# Patient Record
Sex: Female | Born: 1935 | Race: Black or African American | Hispanic: No | Smoking: Never smoker
Health system: Southern US, Community
[De-identification: ages and names within clinical notes are randomized; demographics above are authoritative.]

## PROBLEM LIST (undated history)

## (undated) DIAGNOSIS — E7800 Pure hypercholesterolemia, unspecified: Secondary | ICD-10-CM

## (undated) DIAGNOSIS — I10 Essential (primary) hypertension: Secondary | ICD-10-CM

## (undated) HISTORY — DX: Pure hypercholesterolemia, unspecified: E78.00

## (undated) HISTORY — PX: COLONOSCOPY: SHX174

## (undated) HISTORY — PX: ABDOMINAL HYSTERECTOMY: SHX81

## (undated) HISTORY — DX: Essential (primary) hypertension: I10

---

## 2001-05-04 ENCOUNTER — Other Ambulatory Visit: Admission: RE | Admit: 2001-05-04 | Discharge: 2001-05-04 | Payer: Self-pay | Admitting: Internal Medicine

## 2001-05-25 ENCOUNTER — Encounter: Payer: Self-pay | Admitting: Internal Medicine

## 2001-05-25 ENCOUNTER — Ambulatory Visit (HOSPITAL_COMMUNITY): Admission: RE | Admit: 2001-05-25 | Discharge: 2001-05-25 | Payer: Self-pay | Admitting: Internal Medicine

## 2002-05-25 ENCOUNTER — Ambulatory Visit (HOSPITAL_COMMUNITY): Admission: RE | Admit: 2002-05-25 | Discharge: 2002-05-25 | Payer: Self-pay | Admitting: Internal Medicine

## 2002-05-25 ENCOUNTER — Encounter: Payer: Self-pay | Admitting: Internal Medicine

## 2003-06-23 ENCOUNTER — Ambulatory Visit (HOSPITAL_COMMUNITY): Admission: RE | Admit: 2003-06-23 | Discharge: 2003-06-23 | Payer: Self-pay | Admitting: Internal Medicine

## 2004-12-06 ENCOUNTER — Ambulatory Visit (HOSPITAL_COMMUNITY): Admission: RE | Admit: 2004-12-06 | Discharge: 2004-12-06 | Payer: Self-pay | Admitting: Internal Medicine

## 2005-01-16 ENCOUNTER — Ambulatory Visit: Payer: Self-pay | Admitting: Internal Medicine

## 2005-01-16 ENCOUNTER — Ambulatory Visit (HOSPITAL_COMMUNITY): Admission: RE | Admit: 2005-01-16 | Discharge: 2005-01-16 | Payer: Self-pay | Admitting: Internal Medicine

## 2005-01-16 ENCOUNTER — Encounter: Payer: Self-pay | Admitting: Internal Medicine

## 2005-12-08 ENCOUNTER — Ambulatory Visit (HOSPITAL_COMMUNITY): Admission: RE | Admit: 2005-12-08 | Discharge: 2005-12-08 | Payer: Self-pay | Admitting: Internal Medicine

## 2006-01-09 ENCOUNTER — Ambulatory Visit (HOSPITAL_COMMUNITY): Admission: RE | Admit: 2006-01-09 | Discharge: 2006-01-09 | Payer: Self-pay | Admitting: Internal Medicine

## 2006-01-21 ENCOUNTER — Ambulatory Visit: Payer: Self-pay | Admitting: Orthopedic Surgery

## 2006-12-10 ENCOUNTER — Ambulatory Visit (HOSPITAL_COMMUNITY): Admission: RE | Admit: 2006-12-10 | Discharge: 2006-12-10 | Payer: Self-pay | Admitting: Internal Medicine

## 2007-12-15 ENCOUNTER — Ambulatory Visit (HOSPITAL_COMMUNITY): Admission: RE | Admit: 2007-12-15 | Discharge: 2007-12-15 | Payer: Self-pay | Admitting: Internal Medicine

## 2008-12-15 ENCOUNTER — Ambulatory Visit (HOSPITAL_COMMUNITY): Admission: RE | Admit: 2008-12-15 | Discharge: 2008-12-15 | Payer: Self-pay | Admitting: Internal Medicine

## 2009-10-31 ENCOUNTER — Ambulatory Visit (HOSPITAL_COMMUNITY): Admission: RE | Admit: 2009-10-31 | Discharge: 2009-10-31 | Payer: Self-pay | Admitting: Family Medicine

## 2010-01-01 ENCOUNTER — Ambulatory Visit (HOSPITAL_COMMUNITY): Admission: RE | Admit: 2010-01-01 | Discharge: 2010-01-01 | Payer: Self-pay | Admitting: Internal Medicine

## 2010-10-18 NOTE — Op Note (Signed)
NAME:  Tanya Daniel, Tanya Daniel                  ACCOUNT NO.:  [id number]   MEDICAL RECORD NO.:  [id number]          PATIENT TYPE:  AMB   LOCATION:  DAY                           FACILITY:  [facility]   PHYSICIAN:  R. Roetta Sessions, M.D. DATE OF BIRTH:  [birth year]   DATE OF PROCEDURE:  01/16/2005  DATE OF DISCHARGE:                                 OPERATIVE REPORT   INDICATIONS FOR PROCEDURE:  The patient is a 75 year old lady who was sent  over for colorectal cancer screening at the request of Dr. Felecia Shelling.  She is  devoid of any allergic reaction symptoms.  She has never had a colonoscopy.  There is no family history of colorectal neoplasia.   The colonoscopy is now being done.  This approach has been discussed with  the patient at length.  Potential risks, benefits, and alternatives have  been reviewed.  Questions are answered.  She is agreeable.  Please see  documentation of medical record.   MONITORING FOR PROCEDURE:  O2 Saturation, blood pressure, pulses, and  respirations were monitored during the entire procedure.   CONSCIOUS SEDATION:  Versed 3 mg IV, Demerol 75 mg IV in divided doses.   INSTRUMENT:  Olympus videoscopic system.   FINDINGS:  A digital rectal exam revealed no abnormalities.  __________ prep  was adequate.  Rectal:  There was no abnormality of mucosa.  Retroflexed  view of the anal verge reveals two 4-mm rectal polyps at 10 cm from the anal  verge; please see photos.  The remainder of the rectum does appear normal.  Colon:  Colonic mucosa was surveyed from the rectosigmoid junction through  the left transverse and right colon to the appendiceal orifice, ileocecal  valve, and cecum.  These structures were well seen and photographed for the  record.  The scope was slowly withdrawn.  All previously mentioned mucosa  was again seen.  The colonic mucosa appeared normal.  The polyps in the  rectum were cold biopsied/removed.  The patient tolerated the procedure well  and was reactive  in endoscopy.   IMPRESSION:  1.  Diminutive rectal polyps, otherwise normal rectal.  2.  Normal colon.   RECOMMENDATIONS:  1.  Follow up on pathology.  2.  Further recommendations to follow.      Jonathon Bellows, M.D.  Electronically Signed     RMR/MEDQ  D:  01/16/2005  T:  01/16/2005  Job:  [id number]   cc:   Tesfaye D. Felecia Shelling, MD  [address]  [place]  [place] [address]  Fax: [phone]

## 2010-11-04 ENCOUNTER — Ambulatory Visit (HOSPITAL_COMMUNITY)
Admission: RE | Admit: 2010-11-04 | Discharge: 2010-11-04 | Disposition: A | Discharge status: Home / Self Care | Payer: Medicare Other | Source: Ambulatory Visit | Attending: Internal Medicine | Admitting: Internal Medicine

## 2010-11-04 ENCOUNTER — Other Ambulatory Visit (HOSPITAL_COMMUNITY): Payer: Self-pay | Admitting: Internal Medicine

## 2010-11-04 DIAGNOSIS — M25519 Pain in unspecified shoulder: Secondary | ICD-10-CM

## 2010-11-08 NOTE — Consult Note (Signed)
NAMEYOLANDE, Tanya Daniel                  ACCOUNT NO.:  [id number]  MEDICAL RECORD NO.:  [id number]  LOCATION:  RAD                           FACILITY:  [facility]  PHYSICIAN:  Laurette Schimke, MD     DATE OF BIRTH:  12/03/1935  DATE OF CONSULTATION: DATE OF DISCHARGE:  11/04/2010                                CONSULTATION   REQUESTED BY:  Leighton Roach McDiarmid, M.D.  REASON FOR VISIT:  Referral requested by Dr. McDiarmid for management of a pelvic mass.  HISTORY OF PRESENT ILLNESS:  This is a 75 year old gravida 2, para 2, last normal menstrual period in 1987, at which time she underwent a hysterectomy for menorrhagia because of uterine leiomyoma.  She reports uterine frequency for approximately 1 year.  Denied any weight loss, reported good appetite.  No changes in her bowel or rectal habits.  She was seen by Dr. McDiarmid, and imaging was obtained.  The  CT scan of the abdomen and pelvis was obtained and was notable for left pelvicalyceal fullness.  The ureters were sightly prominent in the pelvis where there was compression by a large septated mid pelvic mass. This mid pelvic mass measured approximately 17.7 x 19.8 x 15.3 cm and was noted to have soft tissue component.  The CA-125 is not available for review.  PAST MEDICAL HISTORY:  Hyperlipidemia.  PAST SURGICAL HISTORY:  Left salpingo-oophorectomy, total abdominal hysterectomy in 1987.  PAST GYN HISTORY:  Menarche occurred at age of 10 with regular menses until development of menorrhagia that required hysterectomy.  She denies a history of abnormal Pap test.  FAMILY HISTORY:  Brother with esophageal cancer diagnosed at the age of 65, who is tobacco user and a second brother with prostate cancer diagnosed at the age of 53.  SOCIAL HISTORY:  She denies tobacco use, reports social alcohol use. She has been widowed since March 2012.  Her daughter has moved in with her.  REVIEW OF SYSTEMS:  No headache, chest pain, shortness of  breath, hemoptysis, no abdominal pain, bloating.  No changes in appetite. Denies early satiety.  No nausea, vomiting, change in bowel or rectal habits.  No hematuria, hematochezia or vaginal bleeding.  No lower extremity edema.  Otherwise, 10-point review of systems is negative.  PHYSICAL EXAMINATION:  GENERAL:  Well-developed female, in no acute distress. VITAL SIGNS:  Weight 215 pounds, height 5 feet 8 inches, blood pressure 140/80, pulse of 86, temperature 98.5. CHEST:  Clear to auscultation. HEART:  Regular rate and rhythm. BACK:  No CVA tenderness. LYMPH NODE SURVEY:  No cervical, subclavicular, or inguinal adenopathy. ABDOMEN:  Soft, nontender.  Midline incision visible.  Mass palpable in the lower abdomen on the right. PELVIC:  Normal external genitalia, Bartholin, urethra, and Skene. Smooth, large, fixed pelvic mass notable on pelvic examination, confirmed on rectal examination. EXTREMITIES:  No clubbing, cyanosis or edema.  IMPRESSION:  Ms. Gordner is a 75 year old with a 19.8 cm pelvic mass with soft tissue component.  CA-125 will be obtained preoperatively.  The recommendation made to her was that of exploratory laparotomy with minimum bilateral salpingo-oophorectomy.  Frozen section findings are indicative of a malignancy.  The plan would  be to proceed with omentectomy, debulking and staging as indicated.  The risks and benefits of surgery discussed with her and her daughter, were inclusive of infection, bleeding, damage to surrounding structures, prolonged hospitalization, and reoperation.  This procedure is planned for December 03, 2010.  All of her questions and those of her daughters were answered to satisfaction.     Laurette Schimke, MD     WB/MEDQ  D:  11/07/2010  T:  11/08/2010  Job:  [id number]  cc:   Leighton Roach McDiarmid, M.D.  Telford Nab, R.N. [address] [place], [place] [address]  Electronically Signed by Laurette Schimke MD on 11/08/2010 09:55:34 AM

## 2010-12-17 ENCOUNTER — Encounter: Payer: Self-pay | Admitting: Orthopedic Surgery

## 2010-12-17 ENCOUNTER — Ambulatory Visit (INDEPENDENT_AMBULATORY_CARE_PROVIDER_SITE_OTHER): Payer: Medicare Other | Admitting: Orthopedic Surgery

## 2010-12-17 ENCOUNTER — Other Ambulatory Visit (HOSPITAL_COMMUNITY): Payer: Self-pay | Admitting: Internal Medicine

## 2010-12-17 DIAGNOSIS — M7580 Other shoulder lesions, unspecified shoulder: Secondary | ICD-10-CM

## 2010-12-17 DIAGNOSIS — Z139 Encounter for screening, unspecified: Secondary | ICD-10-CM

## 2010-12-17 DIAGNOSIS — M25519 Pain in unspecified shoulder: Secondary | ICD-10-CM

## 2010-12-17 DIAGNOSIS — M19019 Primary osteoarthritis, unspecified shoulder: Secondary | ICD-10-CM

## 2010-12-17 DIAGNOSIS — M67919 Unspecified disorder of synovium and tendon, unspecified shoulder: Secondary | ICD-10-CM

## 2010-12-17 NOTE — Patient Instructions (Signed)
You have arthritis of the shoulder and rotator cuff disease   Start exercise program  Ok to take advil or aspirin

## 2010-12-17 NOTE — Progress Notes (Signed)
  Subjective:    Tanya Daniel is a 76 y.o. female who presents with left shoulder pain. The symptoms began several weeks ago. Aggravating factors: no known event. Pain is located around the left deltoid. Discomfort is described as aching. Symptoms are exacerbated by overhead movements. Evaluation to date: plain films: a.c. joint arthritis and possible glenohumeral arthritis with mild glenohumeral degenerative changes. Therapy to date includes: OTC analgesics which are effective and prescription NSAIDS which are effective.  The following portions of the patient's history were reviewed and updated as appropriate: allergies, current medications, past family history, past medical history, past social history, past surgical history and problem list.  Review of Systems A comprehensive review of systems was negative.   Objective:    Resp 18  Ht 5\' 3"  (1.6 m)  Wt 151 lb (68.493 kg)  BMI 26.75 kg/m2 Right shoulder: normal active ROM, no tenderness, no impingement sign  Left shoulder: crepitus with ROM    The LEFT shoulder has a negative impingement sign and a negative Hawkins sign with full range of motion but crepitance no tenderness over the a.c. Joint weakness of the rotator cuff especially in abduction although the shoulder remained stable there is no tenderness around the deltoid.  The cervical spine is normal and the reflexes were normal  The overall appearance of the patient was normal.  Her vital signs are as recorded.  Both upper extremities were normal in terms of radial and ulnar pulses and color.  There is no lymphadenopathy in the axillae and the skin over both shoulders and cervical spine was normal  The patient was awake alert and oriented x3 the mood and affect were normal.  Assessment:    Left acromioclavicular joint arthritis, glenohumeral arthritis, rotator cuff tendinitis    Plan:    Gentle ROM exercises. NSAIDs per medication orders.

## 2011-01-03 ENCOUNTER — Ambulatory Visit (HOSPITAL_COMMUNITY)
Admission: RE | Admit: 2011-01-03 | Discharge: 2011-01-03 | Disposition: A | Discharge status: Home / Self Care | Payer: Medicare Other | Source: Ambulatory Visit | Attending: Internal Medicine | Admitting: Internal Medicine

## 2011-01-03 DIAGNOSIS — Z1231 Encounter for screening mammogram for malignant neoplasm of breast: Secondary | ICD-10-CM | POA: Insufficient documentation

## 2011-01-03 DIAGNOSIS — Z139 Encounter for screening, unspecified: Secondary | ICD-10-CM

## 2012-01-26 ENCOUNTER — Other Ambulatory Visit (HOSPITAL_COMMUNITY): Payer: Self-pay | Admitting: Internal Medicine

## 2012-01-26 DIAGNOSIS — Z139 Encounter for screening, unspecified: Secondary | ICD-10-CM

## 2012-01-27 ENCOUNTER — Ambulatory Visit (HOSPITAL_COMMUNITY)
Admission: RE | Admit: 2012-01-27 | Discharge: 2012-01-27 | Disposition: A | Discharge status: Home / Self Care | Payer: Medicare Other | Source: Ambulatory Visit | Attending: Internal Medicine | Admitting: Internal Medicine

## 2012-01-27 DIAGNOSIS — Z139 Encounter for screening, unspecified: Secondary | ICD-10-CM

## 2012-01-27 DIAGNOSIS — Z1231 Encounter for screening mammogram for malignant neoplasm of breast: Secondary | ICD-10-CM | POA: Insufficient documentation

## 2012-12-21 ENCOUNTER — Other Ambulatory Visit (HOSPITAL_COMMUNITY): Payer: Self-pay | Admitting: Internal Medicine

## 2012-12-21 DIAGNOSIS — Z139 Encounter for screening, unspecified: Secondary | ICD-10-CM

## 2013-02-01 ENCOUNTER — Ambulatory Visit (HOSPITAL_COMMUNITY)
Admission: RE | Admit: 2013-02-01 | Discharge: 2013-02-01 | Disposition: A | Discharge status: Home / Self Care | Payer: Medicare Other | Source: Ambulatory Visit | Attending: Internal Medicine | Admitting: Internal Medicine

## 2013-02-01 DIAGNOSIS — Z1231 Encounter for screening mammogram for malignant neoplasm of breast: Secondary | ICD-10-CM | POA: Insufficient documentation

## 2013-02-01 DIAGNOSIS — Z139 Encounter for screening, unspecified: Secondary | ICD-10-CM

## 2013-09-01 ENCOUNTER — Telehealth: Payer: Self-pay

## 2013-09-01 NOTE — Telephone Encounter (Signed)
Pt returned call. She is not having any problems. No rectal bleed. No anemia. No family hx of colon cancer. She is aware that she will be due next in 01/2015. She will call if she develops any of the above and has any new family hx of colon cancer.

## 2013-09-01 NOTE — Telephone Encounter (Signed)
Pt was referred by Dr. Legrand Rams for screening colonoscopy. Her last one was done by Dr. Gala Romney on 01/16/2005.   The impression was: 1. Diminutive rectal polyps, otherwise normal rectal. 2. Normal colon.  Path: Diagnosis:  1. Polyp ( rectum, polypectomy:      INFLAMED, FOCALLY ULCERATED, HYPERPLASTIC POLYPS

## 2013-09-01 NOTE — Telephone Encounter (Signed)
Pt had received a letter to call. She left Vm and I returned her call and LMOM for a return call.

## 2014-01-18 ENCOUNTER — Other Ambulatory Visit (HOSPITAL_COMMUNITY): Payer: Self-pay | Admitting: Internal Medicine

## 2014-01-18 DIAGNOSIS — Z1231 Encounter for screening mammogram for malignant neoplasm of breast: Secondary | ICD-10-CM

## 2014-01-20 ENCOUNTER — Other Ambulatory Visit (HOSPITAL_COMMUNITY): Payer: Self-pay | Admitting: Internal Medicine

## 2014-01-20 DIAGNOSIS — Z13820 Encounter for screening for osteoporosis: Secondary | ICD-10-CM

## 2014-01-24 ENCOUNTER — Ambulatory Visit (HOSPITAL_COMMUNITY)
Admission: RE | Admit: 2014-01-24 | Discharge: 2014-01-24 | Disposition: A | Discharge status: Home / Self Care | Payer: Medicare Other | Source: Ambulatory Visit | Attending: Internal Medicine | Admitting: Internal Medicine

## 2014-01-24 DIAGNOSIS — Z13820 Encounter for screening for osteoporosis: Secondary | ICD-10-CM | POA: Insufficient documentation

## 2014-02-02 ENCOUNTER — Ambulatory Visit (HOSPITAL_COMMUNITY)
Admission: RE | Admit: 2014-02-02 | Discharge: 2014-02-02 | Disposition: A | Discharge status: Home / Self Care | Payer: Medicare Other | Source: Ambulatory Visit | Attending: Internal Medicine | Admitting: Internal Medicine

## 2014-02-02 DIAGNOSIS — Z1231 Encounter for screening mammogram for malignant neoplasm of breast: Secondary | ICD-10-CM | POA: Diagnosis not present

## 2014-04-02 DEATH — deceased

## 2014-07-21 DIAGNOSIS — M549 Dorsalgia, unspecified: Secondary | ICD-10-CM | POA: Diagnosis not present

## 2014-07-21 DIAGNOSIS — M1990 Unspecified osteoarthritis, unspecified site: Secondary | ICD-10-CM | POA: Diagnosis not present

## 2014-07-21 DIAGNOSIS — E785 Hyperlipidemia, unspecified: Secondary | ICD-10-CM | POA: Diagnosis not present

## 2014-08-24 ENCOUNTER — Telehealth: Payer: Self-pay | Admitting: Internal Medicine

## 2014-08-24 NOTE — Telephone Encounter (Signed)
ON RECALL FOR TCS °

## 2014-08-24 NOTE — Telephone Encounter (Signed)
Letter mailed to pt.  

## 2014-09-22 DIAGNOSIS — I10 Essential (primary) hypertension: Secondary | ICD-10-CM | POA: Diagnosis not present

## 2014-09-22 DIAGNOSIS — M549 Dorsalgia, unspecified: Secondary | ICD-10-CM | POA: Diagnosis not present

## 2014-12-22 DIAGNOSIS — Z0001 Encounter for general adult medical examination with abnormal findings: Secondary | ICD-10-CM | POA: Diagnosis not present

## 2014-12-22 DIAGNOSIS — M1990 Unspecified osteoarthritis, unspecified site: Secondary | ICD-10-CM | POA: Diagnosis not present

## 2014-12-22 DIAGNOSIS — E785 Hyperlipidemia, unspecified: Secondary | ICD-10-CM | POA: Diagnosis not present

## 2014-12-22 DIAGNOSIS — E780 Pure hypercholesterolemia: Secondary | ICD-10-CM | POA: Diagnosis not present

## 2014-12-22 DIAGNOSIS — I10 Essential (primary) hypertension: Secondary | ICD-10-CM | POA: Diagnosis not present

## 2014-12-25 ENCOUNTER — Telehealth: Payer: Self-pay

## 2014-12-25 NOTE — Telephone Encounter (Signed)
Pt received a letter in March 2016 for recall on colonoscopy. Last one was 01/16/2005 by Dr. Gala Romney. I have taken some triage info and she will bring her insurance card by tomorrow and let Marzetta Board make a copy before I get her scheduled.

## 2014-12-28 NOTE — Telephone Encounter (Signed)
Gastroenterology Pre-Procedure Review  Request Date: 12/25/2014 Requesting Physician: PT WAS ON RECALL/ LAST COLONOSCOPY 01/16/2005 BY DR Gala Romney  PATIENT REVIEW QUESTIONS: The patient responded to the following health history questions as indicated:    1. Diabetes Melitis: no 2. Joint replacements in the past 12 months: no 3. Major health problems in the past 3 months: no 4. Has an artificial valve or MVP: no 5. Has a defibrillator: no 6. Has been advised in past to take antibiotics in advance of a procedure like teeth cleaning: no    MEDICATIONS & ALLERGIES:    Patient reports the following regarding taking any blood thinners:   Plavix? no Aspirin? YES Coumadin? no  Patient confirms/reports the following medications:  Current Outpatient Prescriptions  Medication Sig Dispense Refill  . alendronate (FOSAMAX) 70 MG tablet Take 70 mg by mouth once a week. Take with a full glass of water on an empty stomach.    Marland Kitchen lisinopril-hydrochlorothiazide (PRINZIDE,ZESTORETIC) 10-12.5 MG per tablet Take 1 tablet by mouth daily.    . Multiple Vitamin (MULTIVITAMIN) tablet Take 1 tablet by mouth daily.    . NON FORMULARY Vitamin D3    500 IU  Takes 2 tablets daily    . Omega-3 Fatty Acids (FISH OIL) 1000 MG CAPS Take by mouth. Taking one capsule tid    . simvastatin (ZOCOR) 40 MG tablet Take 40 mg by mouth daily.    . traMADol (ULTRAM) 50 MG tablet Take 50 mg by mouth every 6 (six) hours as needed. Only as needed    . Niacin, Antihyperlipidemic, (NIASPAN PO) Take by mouth.       No current facility-administered medications for this visit.    Patient confirms/reports the following allergies:  No Known Allergies  No orders of the defined types were placed in this encounter.    AUTHORIZATION INFORMATION Primary Insurance:   ID #:   Group #:  Pre-Cert / Auth required:  Pre-Cert / Auth #:   Secondary Insurance:   ID #:   Group #: Pre-Cert / Auth required: Pre-Cert / Auth #:   SCHEDULE  INFORMATION: Procedure has been scheduled as follows:  Date: 01/22/2015                 Time:  1:00 pm Location: [facility] Short Stay  This Gastroenterology Pre-Precedure Review Form is being routed to the following provider(s): R. Garfield Cornea, MD

## 2014-12-29 NOTE — Telephone Encounter (Signed)
Appropriate.

## 2015-01-01 MED ORDER — PEG-KCL-NACL-NASULF-NA ASC-C 100 G PO SOLR: 1.0000 | ORAL | Status: DC

## 2015-01-01 NOTE — Telephone Encounter (Signed)
Rx sent to the pharmacy and instructions mailed to pt.  

## 2015-01-03 ENCOUNTER — Other Ambulatory Visit: Payer: Self-pay

## 2015-01-03 DIAGNOSIS — Z1211 Encounter for screening for malignant neoplasm of colon: Secondary | ICD-10-CM

## 2015-01-17 ENCOUNTER — Telehealth: Payer: Self-pay

## 2015-01-17 NOTE — Telephone Encounter (Signed)
I called [facility] @ [phone] and spoke to Heyburn B who said a PA is not required for screening colonoscopy done as out patient.

## 2015-01-22 ENCOUNTER — Ambulatory Visit (HOSPITAL_COMMUNITY)
Admission: RE | Admit: 2015-01-22 | Discharge: 2015-01-22 | Disposition: A | Discharge status: Home / Self Care | Payer: Medicare Other | Source: Ambulatory Visit | Attending: Internal Medicine | Admitting: Internal Medicine

## 2015-01-22 ENCOUNTER — Encounter (HOSPITAL_COMMUNITY)
Admission: RE | Disposition: A | Discharge status: Home / Self Care | Payer: Self-pay | Source: Ambulatory Visit | Attending: Internal Medicine

## 2015-01-22 ENCOUNTER — Encounter (HOSPITAL_COMMUNITY): Payer: Self-pay | Admitting: *Deleted

## 2015-01-22 DIAGNOSIS — Z79899 Other long term (current) drug therapy: Secondary | ICD-10-CM | POA: Diagnosis not present

## 2015-01-22 DIAGNOSIS — D120 Benign neoplasm of cecum: Secondary | ICD-10-CM

## 2015-01-22 DIAGNOSIS — Z1211 Encounter for screening for malignant neoplasm of colon: Secondary | ICD-10-CM | POA: Insufficient documentation

## 2015-01-22 DIAGNOSIS — K635 Polyp of colon: Secondary | ICD-10-CM | POA: Insufficient documentation

## 2015-01-22 DIAGNOSIS — Z7982 Long term (current) use of aspirin: Secondary | ICD-10-CM | POA: Diagnosis not present

## 2015-01-22 DIAGNOSIS — I10 Essential (primary) hypertension: Secondary | ICD-10-CM | POA: Insufficient documentation

## 2015-01-22 DIAGNOSIS — E780 Pure hypercholesterolemia: Secondary | ICD-10-CM | POA: Diagnosis not present

## 2015-01-22 HISTORY — PX: COLONOSCOPY: SHX5424

## 2015-01-22 SURGERY — COLONOSCOPY
Anesthesia: Moderate Sedation

## 2015-01-22 MED ORDER — ONDANSETRON HCL 4 MG/2ML IJ SOLN
INTRAMUSCULAR | Status: DC | PRN
  Administered 2015-01-22: 4 mg via INTRAVENOUS

## 2015-01-22 MED ORDER — MIDAZOLAM HCL 5 MG/5ML IJ SOLN
INTRAMUSCULAR | Status: DC | PRN
  Administered 2015-01-22: 1 mg via INTRAVENOUS
  Administered 2015-01-22 (×2): 2 mg via INTRAVENOUS

## 2015-01-22 MED ORDER — MEPERIDINE HCL 100 MG/ML IJ SOLN
INTRAMUSCULAR | Status: AC
  Filled 2015-01-22: qty 2

## 2015-01-22 MED ORDER — SODIUM CHLORIDE 0.9 % IV SOLN
INTRAVENOUS | Status: DC
  Administered 2015-01-22: 12:00:00 via INTRAVENOUS

## 2015-01-22 MED ORDER — ONDANSETRON HCL 4 MG/2ML IJ SOLN
INTRAMUSCULAR | Status: AC
  Filled 2015-01-22: qty 2

## 2015-01-22 MED ORDER — MEPERIDINE HCL 100 MG/ML IJ SOLN
INTRAMUSCULAR | Status: DC | PRN
  Administered 2015-01-22 (×2): 25 mg via INTRAVENOUS

## 2015-01-22 MED ORDER — MIDAZOLAM HCL 5 MG/5ML IJ SOLN
INTRAMUSCULAR | Status: AC
  Filled 2015-01-22: qty 10

## 2015-01-22 NOTE — Discharge Instructions (Signed)
°Colonoscopy °Discharge Instructions ° °Read the instructions outlined below and refer to this sheet in the next few weeks. These discharge instructions provide you with general information on caring for yourself after you leave the hospital. Your doctor may also give you specific instructions. While your treatment has been planned according to the most current medical practices available, unavoidable complications occasionally occur. If you have any problems or questions after discharge, call Dr. Rourk at [phone]. °ACTIVITY °· You may resume your regular activity, but move at a slower pace for the next 24 hours.  °· Take frequent rest periods for the next 24 hours.  °· Walking will help get rid of the air and reduce the bloated feeling in your belly (abdomen).  °· No driving for 24 hours (because of the medicine (anesthesia) used during the test).   °· Do not sign any important legal documents or operate any machinery for 24 hours (because of the anesthesia used during the test).  °NUTRITION °· Drink plenty of fluids.  °· You may resume your normal diet as instructed by your doctor.  °· Begin with a light meal and progress to your normal diet. Heavy or fried foods are harder to digest and may make you feel sick to your stomach (nauseated).  °· Avoid alcoholic beverages for 24 hours or as instructed.  °MEDICATIONS °· You may resume your normal medications unless your doctor tells you otherwise.  °WHAT YOU CAN EXPECT TODAY °· Some feelings of bloating in the abdomen.  °· Passage of more gas than usual.  °· Spotting of blood in your stool or on the toilet paper.  °IF YOU HAD POLYPS REMOVED DURING THE COLONOSCOPY: °· No aspirin products for 7 days or as instructed.  °· No alcohol for 7 days or as instructed.  °· Eat a soft diet for the next 24 hours.  °FINDING OUT THE RESULTS OF YOUR TEST °Not all test results are available during your visit. If your test results are not back during the visit, make an appointment  with your caregiver to find out the results. Do not assume everything is normal if you have not heard from your caregiver or the medical facility. It is important for you to follow up on all of your test results.  °SEEK IMMEDIATE MEDICAL ATTENTION IF: °· You have more than a spotting of blood in your stool.  °· Your belly is swollen (abdominal distention).  °· You are nauseated or vomiting.  °· You have a temperature over 101.  °· You have abdominal pain or discomfort that is severe or gets worse throughout the day.  ° ° °Polyp information provided ° °Further recommendations to follow pending review of pathology report ° °Colon Polyps °Polyps are lumps of extra tissue growing inside the body. Polyps can grow in the large intestine (colon). Most colon polyps are noncancerous (benign). However, some colon polyps can become cancerous over time. Polyps that are larger than a pea may be harmful. To be safe, caregivers remove and test all polyps. °CAUSES  °Polyps form when mutations in the genes cause your cells to grow and divide even though no more tissue is needed. °RISK FACTORS °There are a number of risk factors that can increase your chances of getting colon polyps. They include: °· Being older than 50 years. °· Family history of colon polyps or colon cancer. °· Long-term colon diseases, such as colitis or Crohn disease. °· Being overweight. °· Smoking. °· Being inactive. °· Drinking too much alcohol. °SYMPTOMS  °  Most small polyps do not cause symptoms. If symptoms are present, they may include: °· Blood in the stool. The stool may look dark red or black. °· Constipation or diarrhea that lasts longer than 1 week. °DIAGNOSIS °People often do not know they have polyps until their caregiver finds them during a regular checkup. Your caregiver can use 4 tests to check for polyps: °· Digital rectal exam. The caregiver wears gloves and feels inside the rectum. This test would find polyps only in the rectum. °· Barium  enema. The caregiver puts a liquid called barium into your rectum before taking X-rays of your colon. Barium makes your colon look white. Polyps are dark, so they are easy to see in the X-ray pictures. °· Sigmoidoscopy. A thin, flexible tube (sigmoidoscope) is placed into your rectum. The sigmoidoscope has a light and tiny camera in it. The caregiver uses the sigmoidoscope to look at the last third of your colon. °· Colonoscopy. This test is like sigmoidoscopy, but the caregiver looks at the entire colon. This is the most common method for finding and removing polyps. °TREATMENT  °Any polyps will be removed during a sigmoidoscopy or colonoscopy. The polyps are then tested for cancer. °PREVENTION  °To help lower your risk of getting more colon polyps: °· Eat plenty of fruits and vegetables. Avoid eating fatty foods. °· Do not smoke. °· Avoid drinking alcohol. °· Exercise every day. °· Lose weight if recommended by your caregiver. °· Eat plenty of calcium and folate. Foods that are rich in calcium include milk, cheese, and broccoli. Foods that are rich in folate include chickpeas, kidney beans, and spinach. °HOME CARE INSTRUCTIONS °Keep all follow-up appointments as directed by your caregiver. You may need periodic exams to check for polyps. °SEEK MEDICAL CARE IF: °You notice bleeding during a bowel movement. °Document Released: 02/13/2004 Document Revised: 08/11/2011 Document Reviewed: 07/29/2011 °ExitCare® Patient Information ©2015 [employer]. This information is not intended to replace advice given to you by your health care provider. Make sure you discuss any questions you have with your health care provider. ° °

## 2015-01-22 NOTE — Op Note (Signed)
[facility] [address] [place], [address]   COLONOSCOPY PROCEDURE REPORT  PATIENT: Tanya, Daniel  MR#: [id number] BIRTHDATE: [birth year] , 79  yrs. old GENDER: female ENDOSCOPIST: R.  Garfield Cornea, MD FACP [facility] REFERRED IH:[id number] Legrand Rams, M.D. PROCEDURE DATE:  2015/02/18 PROCEDURE:   Colonoscopy with snare polypectomy INDICATIONS:Average risk colorectal cancer screening examination. MEDICATIONS: Versed 5 mg IV and Demerol 50 mg IV in divided doses. Zofran 4 mg IV. ASA CLASS:       Class II  CONSENT: The risks, benefits, alternatives and imponderables including but not limited to bleeding, perforation as well as the possibility of a missed lesion have been reviewed.  The potential for biopsy, lesion removal, etc. have also been discussed. Questions have been answered.  All parties agreeable.  Please see the history and physical in the medical record for more information.  DESCRIPTION OF PROCEDURE:   After the risks benefits and alternatives of the procedure were thoroughly explained, informed consent was obtained.  The digital rectal exam revealed no abnormalities of the rectum.   The EC-[id number] ([id number])  endoscope was introduced through the anus and advanced to the cecum, which was identified by both the appendix and ileocecal valve. Without limitations.   The quality of the prep was adequate  The instrument was then slowly withdrawn as the colon was fully examined. Estimated blood loss is zero unless otherwise noted in this procedure report.      COLON FINDINGS: Normal-appearing rectal mucosa.  (1) 5 mm sessile polyp in the base of the cecum; otherwise, the remainder of the colonic mucosa appeared normal.  The above-mentioned polyp was cold snare removed and recovered. Retroflexed views revealed no abnormalities. .  Withdrawal time=12 minutes 0 seconds.  The scope was withdrawn and the procedure completed. COMPLICATIONS: There were no immediate  complications.  ENDOSCOPIC IMPRESSION: Single colonic polyp?"removed as described above  RECOMMENDATIONS: Follow up on pathology. Further recommendations to follow.  eSigned:  R. Garfield Cornea, MD Rosalita Chessman [facility] 02-18-15 2:06 PM   cc:  CPT CODES: ICD CODES:  The ICD and CPT codes recommended by this software are interpretations from the data that the clinical staff has captured with the software.  The verification of the translation of this report to the ICD and CPT codes and modifiers is the sole responsibility of the health care institution and practicing physician where this report was generated.  [employer]. will not be held responsible for the validity of the ICD and CPT codes included on this report.  AMA assumes no liability for data contained or not contained herein. CPT is a Designer, television/film set of the [employer].  PATIENT NAME:  Tanya, Daniel MR#: [id number]

## 2015-01-22 NOTE — H&P (Signed)
'@LOGO' @   Primary Care Physician:  Rosita Fire, MD Primary Gastroenterologist:  Dr. Gala Romney  Pre-Procedure History & Physical: HPI:  Tanya Daniel is a 79 y.o. female is here for a screening colonoscopy.   No bowel symptoms. Last colonoscopy-2006. No family history of colon cancer.  Past Medical History  Diagnosis Date  . HTN (hypertension)   . High cholesterol     Past Surgical History  Procedure Laterality Date  . Abdominal hysterectomy    . Colonoscopy      Prior to Admission medications   Medication Sig Start Date End Date Taking? Authorizing Provider  alendronate (FOSAMAX) 70 MG tablet Take 70 mg by mouth once a week. Take with a full glass of water on an empty stomach.   Yes Historical Provider, MD  aspirin EC 81 MG tablet Take 81 mg by mouth daily.   Yes Historical Provider, MD  lisinopril-hydrochlorothiazide (PRINZIDE,ZESTORETIC) 10-12.5 MG per tablet Take 1 tablet by mouth daily.   Yes Historical Provider, MD  Multiple Vitamin (MULTIVITAMIN) tablet Take 1 tablet by mouth daily.   Yes Historical Provider, MD  NON FORMULARY Take 1 tablet by mouth 2 (two) times daily.    Yes Historical Provider, MD  Omega-3 Fatty Acids (FISH OIL) 1000 MG CAPS Take 1 capsule by mouth 3 (three) times daily.    Yes Historical Provider, MD  peg 3350 powder (MOVIPREP) 100 G SOLR Take 1 kit (200 g total) by mouth as directed. 01/01/15  Yes Daneil Dolin, MD  simvastatin (ZOCOR) 40 MG tablet Take 40 mg by mouth daily.   Yes Historical Provider, MD  traMADol (ULTRAM) 50 MG tablet Take 50 mg by mouth every 6 (six) hours as needed for moderate pain.    Yes Historical Provider, MD    Allergies as of 01/03/2015  . (No Known Allergies)    History reviewed. No pertinent family history.  Social History   Social History  . Marital Status: Widowed    Spouse Name: N/A  . Number of Children: N/A  . Years of Education: 12th grade   Occupational History  . retired    Social History Main Topics  .  Smoking status: Never Smoker   . Smokeless tobacco: Not on file  . Alcohol Use: No  . Drug Use: No  . Sexual Activity: Not on file   Other Topics Concern  . Not on file   Social History Narrative    Review of Systems: See HPI, otherwise negative ROS  Physical Exam: BP 156/68 mmHg  Pulse 94  Temp(Src) 98.8 F (37.1 C) (Oral)  Resp 16  Ht '5\' 3"'  (1.6 m)  Wt 138 lb (62.596 kg)  BMI 24.45 kg/m2  SpO2 97% General:   Alert,  Well-developed, well-nourished, pleasant and cooperative in NAD Head:  Normocephalic and atraumatic. Lungs:  Clear throughout to auscultation.   No wheezes, crackles, or rhonchi. No acute distress. Heart:  Regular rate and rhythm; no murmurs, clicks, rubs,  or gallops. Abdomen:  Soft, nontender and nondistended. No masses, hepatosplenomegaly or hernias noted. Normal bowel sounds, without guarding, and without rebound.   Msk:  Symmetrical without gross deformities. Normal posture. Pulses:  Normal pulses noted. Extremities:  Without clubbing or edema.   Impression/Plan: Tanya Daniel is now here to undergo a screening colonoscopy.  Average risk screening examination  Risks, benefits, limitations, imponderables and alternatives regarding colonoscopy have been reviewed with the patient. Questions have been answered. All parties agreeable.     Notice:  This  dictation was prepared with Dragon dictation along with smaller phrase technology. Any transcriptional errors that result from this process are unintentional and may not be corrected upon review.

## 2015-01-24 ENCOUNTER — Encounter: Payer: Self-pay | Admitting: Internal Medicine

## 2015-01-25 ENCOUNTER — Encounter (HOSPITAL_COMMUNITY): Payer: Self-pay | Admitting: Internal Medicine

## 2015-02-28 ENCOUNTER — Other Ambulatory Visit (HOSPITAL_COMMUNITY): Payer: Self-pay | Admitting: Internal Medicine

## 2015-02-28 DIAGNOSIS — Z1231 Encounter for screening mammogram for malignant neoplasm of breast: Secondary | ICD-10-CM

## 2015-03-05 ENCOUNTER — Ambulatory Visit (HOSPITAL_COMMUNITY)
Admission: RE | Admit: 2015-03-05 | Discharge: 2015-03-05 | Disposition: A | Discharge status: Home / Self Care | Payer: Medicare Other | Source: Ambulatory Visit | Attending: Internal Medicine | Admitting: Internal Medicine

## 2015-03-05 DIAGNOSIS — Z1231 Encounter for screening mammogram for malignant neoplasm of breast: Secondary | ICD-10-CM | POA: Diagnosis present

## 2015-03-23 DIAGNOSIS — Z23 Encounter for immunization: Secondary | ICD-10-CM | POA: Diagnosis not present

## 2015-03-23 DIAGNOSIS — I10 Essential (primary) hypertension: Secondary | ICD-10-CM | POA: Diagnosis not present

## 2015-03-23 DIAGNOSIS — M1990 Unspecified osteoarthritis, unspecified site: Secondary | ICD-10-CM | POA: Diagnosis not present

## 2015-03-23 DIAGNOSIS — E785 Hyperlipidemia, unspecified: Secondary | ICD-10-CM | POA: Diagnosis not present

## 2015-03-23 DIAGNOSIS — M549 Dorsalgia, unspecified: Secondary | ICD-10-CM | POA: Diagnosis not present

## 2015-04-10 ENCOUNTER — Ambulatory Visit (INDEPENDENT_AMBULATORY_CARE_PROVIDER_SITE_OTHER): Payer: Medicare Other | Admitting: Orthopedic Surgery

## 2015-04-10 ENCOUNTER — Ambulatory Visit (INDEPENDENT_AMBULATORY_CARE_PROVIDER_SITE_OTHER): Payer: Medicare Other

## 2015-04-10 VITALS — BP 154/94 | Ht 63.0 in | Wt 142.0 lb

## 2015-04-10 DIAGNOSIS — M25511 Pain in right shoulder: Secondary | ICD-10-CM | POA: Diagnosis not present

## 2015-04-10 DIAGNOSIS — M12811 Other specific arthropathies, not elsewhere classified, right shoulder: Secondary | ICD-10-CM | POA: Diagnosis not present

## 2015-04-10 DIAGNOSIS — M75101 Unspecified rotator cuff tear or rupture of right shoulder, not specified as traumatic: Secondary | ICD-10-CM

## 2015-04-10 NOTE — Progress Notes (Signed)
Patient ID: Tanya Daniel, female   DOB: [birth year], 79 y.o.   MRN: [id number]  Chief Complaint  Patient presents with  . Shoulder Pain    right shoulder pain, no known injury, referred by T. Fanta     Tanya Daniel is a 79 y.o. , femalewho now presents with right shoulder pain  The patient has had several month history of right shoulder pain. She is right-hand dominant. I saw in 2012 for left shoulder pain at that time she had a chronically torn rotator cuff but no significant glenohumeral arthritis her humeral head to acromial distance was decreased but her Shenton's line markings on the left shoulder were normal  She now comes in with constant 8 out of 10 sharp and dull aching pain in the right shoulder with loss of motion unrelieved by tramadol.  She listed her review of systems which was a complete survey as back pain stiffness joints and joint pain. She did not list any medical problems but she does have high cholesterol and hypertension   Review of Systems Review of Systems   Past Medical History  Diagnosis Date  . HTN (hypertension)   . High cholesterol     Past Surgical History  Procedure Laterality Date  . Abdominal hysterectomy    . Colonoscopy    . Colonoscopy N/A 01/22/2015    Procedure: COLONOSCOPY;  Surgeon: Daneil Dolin, MD;  [facility];  Service: Endoscopy;  Laterality: N/A;  1:00 PM    No family history on file.  Social History Social History  Substance Use Topics  . Smoking status: Never Smoker   . Smokeless tobacco: Not on file  . Alcohol Use: No    No Known Allergies  Current Outpatient Prescriptions  Medication Sig Dispense Refill  . alendronate (FOSAMAX) 70 MG tablet Take 70 mg by mouth once a week. Take with a full glass of water on an empty stomach.    . Ascorbic Acid (VITAMIN C PO) Take by mouth.    Marland Kitchen aspirin EC 81 MG tablet Take 81 mg by mouth daily.    . Calcium Carb-Cholecalciferol (CALCIUM + D3 PO) Take by mouth.    Marland Kitchen  lisinopril-hydrochlorothiazide (PRINZIDE,ZESTORETIC) 10-12.5 MG per tablet Take 1 tablet by mouth daily.    . Multiple Vitamin (MULTIVITAMIN) tablet Take 1 tablet by mouth daily.    . Omega-3 Fatty Acids (FISH OIL) 1000 MG CAPS Take 1 capsule by mouth 3 (three) times daily.     . simvastatin (ZOCOR) 40 MG tablet Take 40 mg by mouth daily.    . traMADol (ULTRAM) 50 MG tablet Take 50 mg by mouth every 6 (six) hours as needed for moderate pain.      No current facility-administered medications for this visit.       Physical Exam Blood pressure 154/94, height 5\' 3"  (1.6 m), weight 142 lb (64.411 kg). Physical Exam  Objective:     The patient is awake alert and oriented 3 her mood and affect are normal. She exhibits normal grooming and hygiene without gross deformity.  Gait is normal the noncontributory  On inspection of the right shoulder we find tenderness in the peri-acromial region. The patient exhibits decreased range of motion in all planes with 0 external rotation when the arm is at her side she can return only the front pocket and her passive elevation is 120 active less than 90  The patient could not be tested in abduction external rotation for stability but  inferior subluxation test were normal  The internal and external rotators have normal strength   The skin is warm dry and intact without erythema laceration or previous incision.  Sensation remains normal and the patient has a normal pulse with good perfusion and a warm extremity to touch  Cervical spine is nontender with mild losses in range of motion  Comparison left shoulder examination reveals a elevation of 130 normal strength no instability and no swelling    Data Reviewed My interpretation of the xrays: The right shoulder films taken today show glenohumeral arthritis proximal migration of the humeral head 3 stat head to acromial distance acetabular is a patient of the humeral head with sclerosis of the  undersurface of the acromion.  Diagnosis and by x-ray is chronic rotator cuff tear induced shoulder arthropathy  Assessment chronic rotator cuff tear induced shoulder arthropathy  Plan I think she is a candidate for reverse shoulder prosthesis. She wants to talk about it with her family. If she would like to proceed with that then we would go ahead and send her to Dr. Tamera Punt

## 2015-04-10 NOTE — Patient Instructions (Signed)
You have arthritis of the shoulder and torn rotator cuff. You need a reverse total shoulder replacement.  Let us know if you would like referral to shoulder specialist.

## 2016-02-05 ENCOUNTER — Other Ambulatory Visit (HOSPITAL_COMMUNITY): Payer: Self-pay | Admitting: Internal Medicine

## 2016-02-05 DIAGNOSIS — Z1231 Encounter for screening mammogram for malignant neoplasm of breast: Secondary | ICD-10-CM

## 2016-03-05 ENCOUNTER — Ambulatory Visit (HOSPITAL_COMMUNITY)
Admission: RE | Admit: 2016-03-05 | Discharge: 2016-03-05 | Disposition: A | Discharge status: Home / Self Care | Payer: Medicare Other | Source: Ambulatory Visit | Attending: Internal Medicine | Admitting: Internal Medicine

## 2016-03-05 DIAGNOSIS — Z1231 Encounter for screening mammogram for malignant neoplasm of breast: Secondary | ICD-10-CM | POA: Insufficient documentation

## 2016-03-24 ENCOUNTER — Other Ambulatory Visit (HOSPITAL_COMMUNITY): Payer: Self-pay | Admitting: Internal Medicine

## 2016-03-24 DIAGNOSIS — M79604 Pain in right leg: Secondary | ICD-10-CM

## 2016-03-24 DIAGNOSIS — M79605 Pain in left leg: Principal | ICD-10-CM

## 2016-03-25 ENCOUNTER — Ambulatory Visit (HOSPITAL_COMMUNITY)
Admission: RE | Admit: 2016-03-25 | Discharge: 2016-03-25 | Disposition: A | Discharge status: Home / Self Care | Payer: Medicare Other | Source: Ambulatory Visit | Attending: Internal Medicine | Admitting: Internal Medicine

## 2016-03-25 DIAGNOSIS — M79604 Pain in right leg: Secondary | ICD-10-CM | POA: Insufficient documentation

## 2016-03-25 DIAGNOSIS — M79605 Pain in left leg: Secondary | ICD-10-CM | POA: Insufficient documentation

## 2016-06-24 DIAGNOSIS — I10 Essential (primary) hypertension: Secondary | ICD-10-CM | POA: Diagnosis not present

## 2016-06-24 DIAGNOSIS — E785 Hyperlipidemia, unspecified: Secondary | ICD-10-CM | POA: Diagnosis not present

## 2016-06-24 DIAGNOSIS — Z0000 Encounter for general adult medical examination without abnormal findings: Secondary | ICD-10-CM | POA: Diagnosis not present

## 2016-09-23 DIAGNOSIS — I10 Essential (primary) hypertension: Secondary | ICD-10-CM | POA: Diagnosis not present

## 2016-09-23 DIAGNOSIS — M810 Age-related osteoporosis without current pathological fracture: Secondary | ICD-10-CM | POA: Diagnosis not present

## 2016-09-23 DIAGNOSIS — E785 Hyperlipidemia, unspecified: Secondary | ICD-10-CM | POA: Diagnosis not present

## 2016-12-25 DIAGNOSIS — I10 Essential (primary) hypertension: Secondary | ICD-10-CM | POA: Diagnosis not present

## 2017-01-29 ENCOUNTER — Other Ambulatory Visit (HOSPITAL_COMMUNITY): Payer: Self-pay | Admitting: Internal Medicine

## 2017-01-29 DIAGNOSIS — Z1231 Encounter for screening mammogram for malignant neoplasm of breast: Secondary | ICD-10-CM

## 2017-03-09 ENCOUNTER — Ambulatory Visit (HOSPITAL_COMMUNITY)
Admission: RE | Admit: 2017-03-09 | Discharge: 2017-03-09 | Disposition: A | Discharge status: Home / Self Care | Payer: Medicare Other | Source: Ambulatory Visit | Attending: Internal Medicine | Admitting: Internal Medicine

## 2017-03-09 DIAGNOSIS — Z1231 Encounter for screening mammogram for malignant neoplasm of breast: Secondary | ICD-10-CM | POA: Diagnosis not present

## 2017-03-25 DIAGNOSIS — E785 Hyperlipidemia, unspecified: Secondary | ICD-10-CM | POA: Diagnosis not present

## 2017-03-25 DIAGNOSIS — M810 Age-related osteoporosis without current pathological fracture: Secondary | ICD-10-CM | POA: Diagnosis not present

## 2017-03-25 DIAGNOSIS — Z23 Encounter for immunization: Secondary | ICD-10-CM | POA: Diagnosis not present

## 2017-03-25 DIAGNOSIS — I10 Essential (primary) hypertension: Secondary | ICD-10-CM | POA: Diagnosis not present

## 2017-06-26 DIAGNOSIS — E785 Hyperlipidemia, unspecified: Secondary | ICD-10-CM | POA: Diagnosis not present

## 2017-06-26 DIAGNOSIS — M810 Age-related osteoporosis without current pathological fracture: Secondary | ICD-10-CM | POA: Diagnosis not present

## 2017-06-26 DIAGNOSIS — Z1331 Encounter for screening for depression: Secondary | ICD-10-CM | POA: Diagnosis not present

## 2017-06-26 DIAGNOSIS — Z0001 Encounter for general adult medical examination with abnormal findings: Secondary | ICD-10-CM | POA: Diagnosis not present

## 2017-06-26 DIAGNOSIS — Z1389 Encounter for screening for other disorder: Secondary | ICD-10-CM | POA: Diagnosis not present

## 2017-06-26 DIAGNOSIS — R739 Hyperglycemia, unspecified: Secondary | ICD-10-CM | POA: Diagnosis not present

## 2017-06-26 DIAGNOSIS — I10 Essential (primary) hypertension: Secondary | ICD-10-CM | POA: Diagnosis not present

## 2017-06-26 DIAGNOSIS — Z0000 Encounter for general adult medical examination without abnormal findings: Secondary | ICD-10-CM | POA: Diagnosis not present

## 2017-09-25 DIAGNOSIS — I10 Essential (primary) hypertension: Secondary | ICD-10-CM | POA: Diagnosis not present

## 2017-10-18 ENCOUNTER — Other Ambulatory Visit: Payer: Self-pay

## 2017-10-18 ENCOUNTER — Encounter (HOSPITAL_COMMUNITY): Payer: Self-pay | Admitting: Emergency Medicine

## 2017-10-18 ENCOUNTER — Emergency Department (HOSPITAL_COMMUNITY)
Admission: EM | Admit: 2017-10-18 | Discharge: 2017-10-19 | Disposition: A | Discharge status: Home / Self Care | Payer: Medicare Other | Attending: Emergency Medicine | Admitting: Emergency Medicine

## 2017-10-18 DIAGNOSIS — M47812 Spondylosis without myelopathy or radiculopathy, cervical region: Secondary | ICD-10-CM | POA: Insufficient documentation

## 2017-10-18 DIAGNOSIS — Z7982 Long term (current) use of aspirin: Secondary | ICD-10-CM | POA: Diagnosis not present

## 2017-10-18 DIAGNOSIS — I10 Essential (primary) hypertension: Secondary | ICD-10-CM | POA: Diagnosis not present

## 2017-10-18 DIAGNOSIS — R509 Fever, unspecified: Secondary | ICD-10-CM | POA: Diagnosis not present

## 2017-10-18 DIAGNOSIS — N3000 Acute cystitis without hematuria: Secondary | ICD-10-CM

## 2017-10-18 DIAGNOSIS — M542 Cervicalgia: Secondary | ICD-10-CM

## 2017-10-18 DIAGNOSIS — Z79899 Other long term (current) drug therapy: Secondary | ICD-10-CM | POA: Diagnosis not present

## 2017-10-18 LAB — BASIC METABOLIC PANEL
Anion gap: 11 (ref 5–15)
BUN: 15 mg/dL (ref 6–20)
CO2: 27 mmol/L (ref 22–32)
Calcium: 9 mg/dL (ref 8.9–10.3)
Chloride: 100 mmol/L — ABNORMAL LOW (ref 101–111)
Creatinine, Ser: 0.8 mg/dL (ref 0.44–1.00)
GFR calc Af Amer: >60 mL/min (ref >60)
GFR calc non Af Amer: >60 mL/min (ref >60)
Glucose, Bld: 147 mg/dL — ABNORMAL HIGH (ref 65–99)
Potassium: 3.7 mmol/L (ref 3.5–5.1)
Sodium: 138 mmol/L (ref 135–145)

## 2017-10-18 LAB — URINALYSIS, ROUTINE W REFLEX MICROSCOPIC
Bilirubin Urine: NEGATIVE
Glucose, UA: NEGATIVE mg/dL
Ketones, ur: 80 mg/dL — AB
Nitrite: NEGATIVE
Protein, ur: NEGATIVE mg/dL
Specific Gravity, Urine: 1.02 (ref 1.005–1.030)
pH: 5 (ref 5.0–8.0)

## 2017-10-18 LAB — CBC WITH DIFFERENTIAL/PLATELET
Basophils Absolute: 0 10*3/uL (ref 0.0–0.1)
Basophils Relative: 0 %
Eosinophils Absolute: 0 10*3/uL (ref 0.0–0.7)
Eosinophils Relative: 0 %
HCT: 38.7 % (ref 36.0–46.0)
Hemoglobin: 12.2 g/dL (ref 12.0–15.0)
Lymphocytes Relative: 19 %
Lymphs Abs: 1.7 10*3/uL (ref 0.7–4.0)
MCH: 27 pg (ref 26.0–34.0)
MCHC: 31.5 g/dL (ref 30.0–36.0)
MCV: 85.6 fL (ref 78.0–100.0)
Monocytes Absolute: 0.9 10*3/uL (ref 0.1–1.0)
Monocytes Relative: 10 %
Neutro Abs: 6.6 10*3/uL (ref 1.7–7.7)
Neutrophils Relative %: 71 %
Platelets: 237 10*3/uL (ref 150–400)
RBC: 4.52 MIL/uL (ref 3.87–5.11)
RDW: 12.6 % (ref 11.5–15.5)
WBC: 9.3 10*3/uL (ref 4.0–10.5)

## 2017-10-18 MED ORDER — HYDROCODONE-ACETAMINOPHEN 5-325 MG PO TABS
1.0000 | ORAL_TABLET | Freq: Once | ORAL | Status: AC
  Administered 2017-10-18: 1 via ORAL
  Filled 2017-10-18: qty 1

## 2017-10-18 NOTE — ED Notes (Signed)
Call to CN [facility]

## 2017-10-18 NOTE — ED Provider Notes (Addendum)
[facility] EMERGENCY DEPARTMENT Provider Note   CSN: [id number] Arrival date & time: 10/18/17  1609     History   Chief Complaint Chief Complaint  Patient presents with  . Neck Pain    HPI Tanya Daniel is a 82 y.o. female.complains of posterior neck pain onset yesterday which radiates to both shoulders. Pain is worse when she rotates her neck from side to side and improved with many still. She denies headache denies fever denies sore throat denies cough denies chest pain. No urinary symptoms no abdominal pain. No other associated symptoms. No treatment prior to coming here.  HPI  Past Medical History:  Diagnosis Date  . High cholesterol   . HTN (hypertension)     There are no active problems to display for this patient.   Past Surgical History:  Procedure Laterality Date  . ABDOMINAL HYSTERECTOMY    . COLONOSCOPY    . COLONOSCOPY N/A 01/22/2015   Procedure: COLONOSCOPY;  Surgeon: Daneil Dolin, MD;  Location: [facility];  Service: Endoscopy;  Laterality: N/A;  1:00 PM     OB History   None      Home Medications    Prior to Admission medications   Medication Sig Start Date End Date Taking? Authorizing Provider  alendronate (FOSAMAX) 70 MG tablet Take 70 mg by mouth once a week. Take with a full glass of water on an empty stomach.    [provider]  Ascorbic Acid (VITAMIN C PO) Take by mouth.    [provider]  aspirin EC 81 MG tablet Take 81 mg by mouth daily.    [provider]  Calcium Carb-Cholecalciferol (CALCIUM + D3 PO) Take by mouth.    [provider]  lisinopril-hydrochlorothiazide (PRINZIDE,ZESTORETIC) 10-12.5 MG per tablet Take 1 tablet by mouth daily.    [provider]  Multiple Vitamin (MULTIVITAMIN) tablet Take 1 tablet by mouth daily.    [provider]  Omega-3 Fatty Acids (FISH OIL) 1000 MG CAPS Take 1 capsule by mouth 3 (three) times daily.     [provider]  simvastatin  (ZOCOR) 40 MG tablet Take 40 mg by mouth daily.    [provider]  traMADol (ULTRAM) 50 MG tablet Take 50 mg by mouth every 6 (six) hours as needed for moderate pain.     [provider]    Family History No family history on file.  Social History Social History   Tobacco Use  . Smoking status: Never Smoker  . Smokeless tobacco: Never Used  Substance Use Topics  . Alcohol use: No  . Drug use: No     Allergies   Patient has no known allergies.   Review of Systems Review of Systems  Musculoskeletal: Positive for neck pain.  All other systems reviewed and are negative.    Physical Exam Updated Vital Signs BP (!) 154/77   Pulse (!) 105   Temp (!) 100.6 F (38.1 C) (Oral)   Resp 15   Ht 5\' 3"  (1.6 m)   Wt 60.8 kg (134 lb)   SpO2 95%   BMI 23.74 kg/m   Physical Exam  Constitutional: She is oriented to person, place, and time. She appears well-developed and well-nourished.  HENT:  Head: Normocephalic and atraumatic.  Eyes: Pupils are equal, round, and reactive to light. Conjunctivae are normal.  Neck: Neck supple. No tracheal deviation present. No thyromegaly present.  She has pain at neck when rotating to left or right.  No signs of meningitis  Cardiovascular: Normal rate and regular rhythm.  No murmur heard. Pulmonary/Chest: Effort normal and breath sounds normal.  Abdominal: Soft. Bowel sounds are normal. She exhibits no distension. There is no tenderness.  Musculoskeletal: Normal range of motion. She exhibits no edema or tenderness.  All 4 extremities without redness or tenderness neurovascular intact.  Neurological: She is alert and oriented to person, place, and time. Coordination normal.  Skin: Skin is warm and dry. Capillary refill takes less than 2 seconds. No rash noted.  Psychiatric: She has a normal mood and affect.  Nursing note and vitals reviewed.    ED Treatments / Results  Labs (all labs ordered are listed, but only abnormal  results are displayed) Labs Reviewed  CULTURE, BLOOD (ROUTINE X 2)  CULTURE, BLOOD (ROUTINE X 2)  CBC WITH DIFFERENTIAL/PLATELET  URINALYSIS, ROUTINE W REFLEX MICROSCOPIC  BASIC METABOLIC PANEL   Results for orders placed or performed during the hospital encounter of 10/18/17  Blood culture (routine x 2)  Result Value Ref Range   Specimen Description      BLOOD RIGHT HAND BOTTLES DRAWN AEROBIC AND ANAEROBIC   Special Requests      Blood Culture adequate volume Performed at [facility], [address], [place], [place] [address]    Culture PENDING    Report Status PENDING   CBC with Differential/Platelet  Result Value Ref Range   WBC 9.3 4.0 - 10.5 K/uL   RBC 4.52 3.87 - 5.11 MIL/uL   Hemoglobin 12.2 12.0 - 15.0 g/dL   HCT 38.7 36.0 - 46.0 %   MCV 85.6 78.0 - 100.0 fL   MCH 27.0 26.0 - 34.0 pg   MCHC 31.5 30.0 - 36.0 g/dL   RDW 12.6 11.5 - 15.5 %   Platelets 237 150 - 400 K/uL   Neutrophils Relative % 71 %   Neutro Abs 6.6 1.7 - 7.7 K/uL   Lymphocytes Relative 19 %   Lymphs Abs 1.7 0.7 - 4.0 K/uL   Monocytes Relative 10 %   Monocytes Absolute 0.9 0.1 - 1.0 K/uL   Eosinophils Relative 0 %   Eosinophils Absolute 0.0 0.0 - 0.7 K/uL   Basophils Relative 0 %   Basophils Absolute 0.0 0.0 - 0.1 K/uL  Urinalysis, Routine w reflex microscopic  Result Value Ref Range   Color, Urine YELLOW YELLOW   APPearance HAZY (A) CLEAR   Specific Gravity, Urine 1.020 1.005 - 1.030   pH 5.0 5.0 - 8.0   Glucose, UA NEGATIVE NEGATIVE mg/dL   Hgb urine dipstick SMALL (A) NEGATIVE   Bilirubin Urine NEGATIVE NEGATIVE   Ketones, ur 80 (A) NEGATIVE mg/dL   Protein, ur NEGATIVE NEGATIVE mg/dL   Nitrite NEGATIVE NEGATIVE   Leukocytes, UA MODERATE (A) NEGATIVE   RBC / HPF 0-5 0 - 5 RBC/hpf   WBC, UA 21-50 0 - 5 WBC/hpf   Bacteria, UA RARE (A) NONE SEEN   Squamous Epithelial / LPF 6-10 0 - 5   Mucus PRESENT   Basic metabolic panel  Result Value Ref Range   Sodium 138 135 - 145 mmol/L    Potassium 3.7 3.5 - 5.1 mmol/L   Chloride 100 (L) 101 - 111 mmol/L   CO2 27 22 - 32 mmol/L   Glucose, Bld 147 (H) 65 - 99 mg/dL   BUN 15 6 - 20 mg/dL   Creatinine, Ser 0.80 0.44 - 1.00 mg/dL   Calcium 9.0 8.9 - 10.3 mg/dL   GFR calc  non Af Amer >60 >60 mL/min   GFR calc Af Amer >60 >60 mL/min   Anion gap 11 5 - 15   No results found. EKG None  Radiology No results found. Results for orders placed or performed during the hospital encounter of 10/18/17  Blood culture (routine x 2)  Result Value Ref Range   Specimen Description BLOOD RIGHT HAND    Special Requests      BOTTLES DRAWN AEROBIC AND ANAEROBIC Blood Culture adequate volume   Culture      NO GROWTH < 24 HOURS Performed at [facility], [address] [place], [place] [address]    Report Status PENDING   Blood culture (routine x 2)  Result Value Ref Range   Specimen Description      BLOOD RIGHT HAND BOTTLES DRAWN AEROBIC AND ANAEROBIC   Special Requests Blood Culture adequate volume    Culture      NO GROWTH < 24 HOURS Performed at [facility], [address], [place], [place] [address]    Report Status PENDING   CBC with Differential/Platelet  Result Value Ref Range   WBC 9.3 4.0 - 10.5 K/uL   RBC 4.52 3.87 - 5.11 MIL/uL   Hemoglobin 12.2 12.0 - 15.0 g/dL   HCT 38.7 36.0 - 46.0 %   MCV 85.6 78.0 - 100.0 fL   MCH 27.0 26.0 - 34.0 pg   MCHC 31.5 30.0 - 36.0 g/dL   RDW 12.6 11.5 - 15.5 %   Platelets 237 150 - 400 K/uL   Neutrophils Relative % 71 %   Neutro Abs 6.6 1.7 - 7.7 K/uL   Lymphocytes Relative 19 %   Lymphs Abs 1.7 0.7 - 4.0 K/uL   Monocytes Relative 10 %   Monocytes Absolute 0.9 0.1 - 1.0 K/uL   Eosinophils Relative 0 %   Eosinophils Absolute 0.0 0.0 - 0.7 K/uL   Basophils Relative 0 %   Basophils Absolute 0.0 0.0 - 0.1 K/uL  Urinalysis, Routine w reflex microscopic  Result Value Ref Range   Color, Urine YELLOW YELLOW   APPearance HAZY (A) CLEAR   Specific Gravity, Urine 1.020 1.005 - 1.030     pH 5.0 5.0 - 8.0   Glucose, UA NEGATIVE NEGATIVE mg/dL   Hgb urine dipstick SMALL (A) NEGATIVE   Bilirubin Urine NEGATIVE NEGATIVE   Ketones, ur 80 (A) NEGATIVE mg/dL   Protein, ur NEGATIVE NEGATIVE mg/dL   Nitrite NEGATIVE NEGATIVE   Leukocytes, UA MODERATE (A) NEGATIVE   RBC / HPF 0-5 0 - 5 RBC/hpf   WBC, UA 21-50 0 - 5 WBC/hpf   Bacteria, UA RARE (A) NONE SEEN   Squamous Epithelial / LPF 6-10 0 - 5   Mucus PRESENT   Basic metabolic panel  Result Value Ref Range   Sodium 138 135 - 145 mmol/L   Potassium 3.7 3.5 - 5.1 mmol/L   Chloride 100 (L) 101 - 111 mmol/L   CO2 27 22 - 32 mmol/L   Glucose, Bld 147 (H) 65 - 99 mg/dL   BUN 15 6 - 20 mg/dL   Creatinine, Ser 0.80 0.44 - 1.00 mg/dL   Calcium 9.0 8.9 - 10.3 mg/dL   GFR calc non Af Amer >60 >60 mL/min   GFR calc Af Amer >60 >60 mL/min   Anion gap 11 5 - 15   Mr Cervical Spine W Or Wo Contrast  Result Date: 10/19/2017 CLINICAL DATA:  Initial evaluation for acute neck pain, radiating to right  shoulder. EXAM: MRI CERVICAL SPINE WITHOUT AND WITH CONTRAST TECHNIQUE: Multiplanar and multiecho pulse sequences of the cervical spine, to include the craniocervical junction and cervicothoracic junction, were obtained without and with intravenous contrast. CONTRAST:  31mL MULTIHANCE GADOBENATE DIMEGLUMINE 529 MG/ML IV SOLN COMPARISON:  None. FINDINGS: Alignment: Smooth reversal of the normal cervical lordosis. Trace anterolisthesis of C7 on T1, chronic and facet mediated. Vertebrae: Vertebral body heights maintained without evidence for acute or chronic fracture. Bone marrow signal intensity mildly heterogeneous but within normal limits. No discrete or worrisome osseous lesions. C6 and C7 vertebral bodies are partially ankylosed posteriorly on the left. Reactive marrow edema about the right C4-5 facet due to facet arthritis (series 7001, image 2). No other abnormal marrow edema or enhancement. Cord: Signal intensity within the cervical spinal cord  is normal. No abnormal enhancement. Posterior Fossa, vertebral arteries, paraspinal tissues: Chronic microvascular ischemic changes present within the visualized brainstem. Visualized brain and posterior fossa otherwise unremarkable. Craniocervical junction normal. Paraspinous and prevertebral soft tissues normal. Normal intravascular flow voids present within the vertebral arteries bilaterally. Disc levels: C2-C3: Mild diffuse disc bulge with bilateral uncovertebral hypertrophy. Left greater than right facet degeneration. No significant stenosis. C3-C4: Broad posterior disc bulge with bilateral uncovertebral hypertrophy. Advanced right-sided facet degeneration. Mild ligamentum flavum thickening. Resultant mild spinal stenosis. Moderate bilateral C4 foraminal narrowing, right worse than left. C4-C5: Diffuse disc bulge, asymmetric to the right. Bilateral uncovertebral hypertrophy. Right worse than left facet hypertrophy with ligamentum flavum thickening. Resultant moderate spinal stenosis. Severe bilateral C5 foraminal stenosis, right worse than left. C5-C6: Chronic diffuse degenerative disc osteophyte with left greater than right uncovertebral spurring. Severe left-sided facet degeneration. Ligamentum flavum thickening. Superimposed small central disc protrusion indents the ventral thecal sac. Moderate spinal stenosis. Severe left with moderate right C6 foraminal stenosis. C6-C7: Diffuse disc bulge with bilateral uncovertebral hypertrophy. Left C6-7 facet is ankylosed. Mild spinal stenosis. Moderate left with mild right C7 foraminal stenosis. C7-T1: Trace anterolisthesis. Mild disc bulge with bilateral uncovertebral hypertrophy. Moderate bilateral facet and ligament flavum hypertrophy. Resultant mild canal stenosis. Moderate left with mild right C8 foraminal narrowing. T1-2: Diffuse disc bulge with intervertebral disc space narrowing and disc desiccation. Bilateral facet hypertrophy. Resultant mild to moderate  spinal stenosis with mild bilateral foraminal narrowing. Additional disc bulging noted at T2-3 and T3-4 without significant canal stenosis. Bilateral facet hypertrophy noted at T2-3, left greater than right. IMPRESSION: 1. No acute abnormality within the cervical spine. 2. Moderate multilevel cervical spondylolysis with resultant mild to moderate diffuse spinal stenosis at C3-4 through C6-7, most pronounced at C4-5 and C5-6. 3. Multifactorial degenerative changes with resultant multilevel foraminal narrowing as above. Notable findings include moderate bilateral C4 foraminal stenosis, severe bilateral C5 foraminal narrowing, severe left with moderate right C6 foraminal stenosis, with moderate left C7 and C8 foraminal narrowing. 4. Reactive marrow edema about the right C4-5 facet due to facet arthritis. Finding could serve as a source for acute neck pain. Electronically Signed   By: Jeannine Boga M.D.   On: 10/19/2017 02:44   Procedures Procedures (including critical care time)  Medications Ordered in ED Medications - No data to display   Initial Impression / Assessment and Plan / ED Course  I have reviewed the triage vital signs and the nursing notes.  Pertinent labs & imaging results that were available during my care of the patient were reviewed by me and considered in my medical decision making (see chart for details).  Clinical Course as of Oct 19 1444  Mon Oct 19, 2017  0307 MRI reviewed and shows severe facet arthritis C4-C5 with reactive marrow edema, possibly source of pain.   [AM]    Clinical Course User Index [AM] Langston Masker B, PA-C    In light of fever, patient will be transferred to [facility] emergency department for MRI scan of cervical  Spine. She'll be transported b. Concern for infection, discitis versus epidural abscess. I spoke with Dr. Reather Converse who is accepting physician Lab work remarkable for mild hyperglycemia andpyuria the patient has no urinary symptoms. Urine  sent for culture. Final Clinical Impressions(s) / ED Diagnoses  Dx #1 neck pain #2 fever Final diagnoses:  None    ED Discharge Orders    None       Orlie Dakin, MD 10/18/17 1719    Orlie Dakin, MD 10/18/17 [place], MD 10/19/17 1446

## 2017-10-18 NOTE — ED Notes (Signed)
Call to [facility] re Culture

## 2017-10-18 NOTE — ED Notes (Signed)
Call to [facility]  ED CN  No answer to phone when transferred

## 2017-10-18 NOTE — ED Notes (Signed)
Report to Peggye Form

## 2017-10-18 NOTE — ED Provider Notes (Addendum)
[place] EMERGENCY DEPARTMENT Provider Note   CSN: [id number] Arrival date & time: 10/18/17  1609     History   Chief Complaint Chief Complaint  Patient presents with  . Neck Pain    HPI Tanya Daniel is a 82 y.o. female.  HPI 82 year old female who was sent from outside ED for evaluation of neck pain.  Patient states the pain began yesterday with radiation to the right shoulder.  Patient states that the pain is made worse by moving her neck from side to side but denies any stiffness.  Patient denies headaches, fevers, chills, nausea or vomiting.  Patient denies any weakness or numbness, denies changes in vision or hearing.  Patient denies chest pain or shortness of breath.   Past Medical History:  Diagnosis Date  . High cholesterol   . HTN (hypertension)     There are no active problems to display for this patient.   Past Surgical History:  Procedure Laterality Date  . ABDOMINAL HYSTERECTOMY    . COLONOSCOPY    . COLONOSCOPY N/A 01/22/2015   Procedure: COLONOSCOPY;  Surgeon: Daneil Dolin, MD;  Location: [facility];  Service: Endoscopy;  Laterality: N/A;  1:00 PM     OB History   None      Home Medications    Prior to Admission medications   Medication Sig Start Date End Date Taking? Authorizing Provider  alendronate (FOSAMAX) 70 MG tablet Take 70 mg by mouth once a week. Take with a full glass of water on an empty stomach.   Yes [provider]  amLODipine (NORVASC) 5 MG tablet Take 1 tablet by mouth daily. 09/25/17  Yes [provider]  Ascorbic Acid (VITAMIN C PO) Take by mouth.   Yes [provider]  aspirin EC 81 MG tablet Take 81 mg by mouth daily.   Yes [provider]  Calcium Carb-Cholecalciferol (CALCIUM + D3 PO) Take by mouth.   Yes [provider]  lisinopril-hydrochlorothiazide (PRINZIDE,ZESTORETIC) 10-12.5 MG per tablet Take 1 tablet by mouth daily.   Yes [provider]    Omega-3 Fatty Acids (FISH OIL) 1000 MG CAPS Take 1 capsule by mouth 3 (three) times daily.    Yes [provider]  simvastatin (ZOCOR) 40 MG tablet Take 40 mg by mouth daily.   Yes [provider]  traMADol (ULTRAM) 50 MG tablet Take 50 mg by mouth every 6 (six) hours as needed for moderate pain.    Yes [provider]  cephALEXin (KEFLEX) 500 MG capsule Take 1 capsule (500 mg total) by mouth 2 (two) times daily for 5 days. 10/19/17 10/24/17  Langston Masker B, PA-C  HYDROcodone-acetaminophen (NORCO/VICODIN) 5-325 MG tablet Take 0.5 tablets by mouth every 6 (six) hours as needed. 10/19/17   Albesa Seen, PA-C    Family History No family history on file.  Social History Social History   Tobacco Use  . Smoking status: Never Smoker  . Smokeless tobacco: Never Used  Substance Use Topics  . Alcohol use: No  . Drug use: No     Allergies   Patient has no known allergies.   Review of Systems Review of Systems  Constitutional: Negative for chills and fever.  Eyes: Negative for visual disturbance.  Respiratory: Negative for chest tightness and shortness of breath.   Cardiovascular: Negative for chest pain and leg swelling.  Gastrointestinal: Negative for abdominal pain.  Musculoskeletal: Positive for arthralgias and neck pain. Negative for neck  stiffness.  Neurological: Negative for dizziness, weakness, light-headedness, numbness and headaches.  All other systems reviewed and are negative.    Physical Exam Updated Vital Signs BP (!) 157/65 (BP Location: Left Arm)   Pulse 86   Temp 98.9 F (37.2 C) (Oral)   Resp 14   Ht 5\' 3"  (1.6 m)   Wt 60.8 kg (134 lb)   SpO2 98%   BMI 23.74 kg/m   Physical Exam  Constitutional: She appears well-developed and well-nourished. No distress.  HENT:  Head: Normocephalic and atraumatic.  Eyes: Pupils are equal, round, and reactive to light. Conjunctivae and EOM are normal.  Neck: Normal range of motion. Neck  supple.  Cardiovascular: Normal rate and regular rhythm.  No murmur heard. Pulmonary/Chest: Effort normal and breath sounds normal. No respiratory distress.  Abdominal: Soft. There is no tenderness.  Musculoskeletal: She exhibits tenderness. She exhibits no edema.  Patient unable to abduct right upper extremity above the level of the shoulder.  Patient states this is chronic.    Neurological: She is alert. No cranial nerve deficit or sensory deficit. She exhibits normal muscle tone. Coordination normal.  Skin: Skin is warm and dry.  Psychiatric: She has a normal mood and affect.  Nursing note and vitals reviewed.    ED Treatments / Results  Labs (all labs ordered are listed, but only abnormal results are displayed) Labs Reviewed  URINE CULTURE - Abnormal; Notable for the following components:      Result Value   Culture   (*)    Value: <10,000 COLONIES/mL INSIGNIFICANT GROWTH Performed at [facility], [address] [place], [place] [address]    All other components within normal limits  URINALYSIS, ROUTINE W REFLEX MICROSCOPIC - Abnormal; Notable for the following components:   APPearance HAZY (*)    Hgb urine dipstick SMALL (*)    Ketones, ur 80 (*)    Leukocytes, UA MODERATE (*)    Bacteria, UA RARE (*)    All other components within normal limits  BASIC METABOLIC PANEL - Abnormal; Notable for the following components:   Chloride 100 (*)    Glucose, Bld 147 (*)    All other components within normal limits  CULTURE, BLOOD (ROUTINE X 2)  CULTURE, BLOOD (ROUTINE X 2)  CBC WITH DIFFERENTIAL/PLATELET    EKG None  Radiology No results found.  Procedures Procedures (including critical care time)  Medications Ordered in ED Medications  HYDROcodone-acetaminophen (NORCO/VICODIN) 5-325 MG per tablet 1 tablet (1 tablet Oral Given 10/18/17 2220)  gadobenate dimeglumine (MULTIHANCE) injection 12 mL (12 mLs Intravenous Contrast Given 10/19/17 0132)  cephALEXin (KEFLEX)  capsule 500 mg (500 mg Oral Given 10/19/17 0353)     Initial Impression / Assessment and Plan / ED Course  I have reviewed the triage vital signs and the nursing notes.  Pertinent labs & imaging results that were available during my care of the patient were reviewed by me and considered in my medical decision making (see chart for details).  Clinical Course as of Oct 21 2324  Mon Oct 19, 2017  0307 MRI reviewed and shows severe facet arthritis C4-C5 with reactive marrow edema, possibly source of pain.   [AM]    Clinical Course User Index [AM] Albesa Seen, PA-C    Patient was febrile at outside ED and was sent for further evaluation via MRI of the C-spine.  Will collect MR with without.  On exam, patient is unable to raise her right arm above  the level of the shoulder, however she states is a chronic problem and is scheduled for rotator cuff surgery.  She denies any new weakness numbness or decreased range of motion.  Patient without tenderness palpation of the cervical spine.  Patient with mild tenderness to palpation of the right shoulder.  Cranial nerves II through XII intact.  Patient with normal strength and sensory.   Patient awaiting MRI.  Care patient handoff to oncoming team please see their documentation for further details. Final Clinical Impressions(s) / ED Diagnoses   Final diagnoses:  Neck pain  Arthropathy of cervical facet joint  Acute cystitis without hematuria  Fever, unspecified fever cause    ED Discharge Orders        Ordered    cephALEXin (KEFLEX) 500 MG capsule  2 times daily     10/19/17 0316    HYDROcodone-acetaminophen (NORCO/VICODIN) 5-325 MG tablet  Every 6 hours PRN     10/19/17 0319       Chapman Moss, MD 10/19/17 Carmelina Noun, MD 10/19/17 0040    Chapman Moss, MD 10/21/17 4098    Elnora Morrison, MD 10/27/17 [phone]

## 2017-10-18 NOTE — ED Notes (Signed)
Report to [place], RN, CN, [facility] ED

## 2017-10-18 NOTE — ED Notes (Signed)
Dr J in to assess 

## 2017-10-18 NOTE — ED Notes (Signed)
[facility] called, Truck in route for [facility] ER- [facility] ER transfer.

## 2017-10-18 NOTE — ED Notes (Signed)
Patient ambulated independently to restroom.  Gait steady and even.   

## 2017-10-18 NOTE — ED Triage Notes (Signed)
Patient c/o right side neck pain that radiates into right shoulder. Per patient started as a mild ache and is progressively getting worse. Denies any known injury. Per patient pain worse with movement. Denies any numbness, tingling, or chest pain. Per patient hx of arthritis.

## 2017-10-19 ENCOUNTER — Emergency Department (HOSPITAL_COMMUNITY): Payer: Medicare Other

## 2017-10-19 DIAGNOSIS — M542 Cervicalgia: Secondary | ICD-10-CM | POA: Diagnosis not present

## 2017-10-19 MED ORDER — CEPHALEXIN 250 MG PO CAPS
500.0000 mg | ORAL_CAPSULE | Freq: Once | ORAL | Status: AC
  Administered 2017-10-19: 500 mg via ORAL
  Filled 2017-10-19: qty 2

## 2017-10-19 MED ORDER — GADOBENATE DIMEGLUMINE 529 MG/ML IV SOLN
12.0000 mL | Freq: Once | INTRAVENOUS | Status: AC | PRN
  Administered 2017-10-19: 12 mL via INTRAVENOUS

## 2017-10-19 MED ORDER — HYDROCODONE-ACETAMINOPHEN 5-325 MG PO TABS: 0.5000 | ORAL_TABLET | Freq: Four times a day (QID) | ORAL | 0 refills | Status: AC | PRN

## 2017-10-19 MED ORDER — CEPHALEXIN 500 MG PO CAPS: 500.0000 mg | ORAL_CAPSULE | Freq: Two times a day (BID) | ORAL | 0 refills | Status: AC

## 2017-10-19 NOTE — ED Notes (Signed)
MRI called. Sending for patient now

## 2017-10-19 NOTE — Discharge Instructions (Signed)
Please see the information and instructions below regarding your visit.  Your diagnoses today include:  1. Neck pain   2. Arthropathy of cervical facet joint   3. Acute cystitis without hematuria   4. Fever, unspecified fever cause    Please see the MRI report I included in your paperwork.  Your MRI shows that you have arthritis at multiple levels, particularly on the right.  There was no signs of an infection in the spinal cord around the spinal cord.  Tests performed today include: See side panel of your discharge paperwork for testing performed today. Vital signs are listed at the bottom of these instructions.   Your work-up also showed that you have some infection fighting cells in your urine.  We will treat this with a short course of antibiotics called Keflex.  Please finish all of your antibiotics.  Medications:   Please take all of your antibiotics until finished.   You may develop abdominal discomfort or nausea from the antibiotic. If this occurs, you may take it with food. Some patients also get diarrhea with antibiotics. You may help offset this with probiotics which you can buy or get in yogurt. Do not eat or take the probiotics until 2 hours after your antibiotic. Some women develop vaginal yeast infections after antibiotics. If you develop unusual vaginal discharge after being on this medication, please see your primary care provider.   Some people develop allergies to antibiotics. Symptoms of antibiotic allergy can be mild and include a flat rash and itching. They can also be more serious and include:  ?Hives - Hives are raised, red patches of skin that are usually very itchy.  ?Lip or tongue swelling  ?Trouble swallowing or breathing  ?Blistering of the skin or mouth.  If you have any of these serious symptoms, please seek emergency medical care immediately.   You have been prescribed Norco for pain. This is an opioid pain medication. You may take this medication every  4-6 hours as needed for pain. Only take this medication if you need it for breakthrough pain.   Do not combine this medication with Tylenol, as it may increase the risk of liver problems.  Do not combine this medication with alcohol.  Please be advised to avoid driving or operating heavy machinery while taking this medication, as it may make you drowsy or impair judgment.   Home care instructions:  Please follow any educational materials contained in this packet.   I recommend salon PAS patches for the neck.  Follow-up instructions: Please follow-up with your primary care provider in 5-7 for further evaluation of your symptoms if they are not completely improved.   Please follow up with neurosurgery as you and your primary doctor discuss.  Return instructions:  Please return to the Emergency Department if you experience worsening symptoms.  Please return to the emergency department if you develop any weakness or numbness in your extremities, worsening neck pain, vision changes, feeling that you are dizzy or lightheaded, or high fevers. Please return if you have any other emergent concerns.  Additional Information:   Your vital signs today were: BP (!) 157/65 (BP Location: Left Arm)    Pulse 86    Temp 98.9 F (37.2 C) (Oral)    Resp 14    Ht 5\' 3"  (1.6 m)    Wt 60.8 kg (134 lb)    SpO2 98%    BMI 23.74 kg/m  If your blood pressure (BP) was elevated on multiple readings during  this visit above 130 for the top number or above 80 for the bottom number, please have this repeated by your primary care provider within one month. --------------  Thank you for allowing [place] to participate in your care today.

## 2017-10-19 NOTE — ED Provider Notes (Signed)
Physical Exam  BP (!) 152/61 (BP Location: Right Arm)   Pulse 87   Temp 98.9 F (37.2 C) (Oral)   Resp 16   Ht 5\' 3"  (1.6 m)   Wt 60.8 kg (134 lb)   SpO2 98%   BMI 23.74 kg/m   Assumed care from Dr. Gwynne Edinger at 1230. Briefly, the patient is a 82 y.o. female with PMHx of  has a past medical history of High cholesterol and HTN (hypertension). here with neck pain.  Patient transferred from any [facility] for [facility], to rule epidural abscess as patient had low-grade temperature at 100.6.  Patient is not diabetic.  No other immunocompromising risk factors.  Awaiting MRI at this time.   Patient reporting improved pain after Norco.  Patient denying any weakness or numbness in upper or lower extremities.  Patient denying chest pain or shortness of breath.  Patient reporting only pain with lateral rotation of the neck.  No neck stiffness or nuchal rigidity. No visual disturbance.   Labs Reviewed  URINALYSIS, ROUTINE W REFLEX MICROSCOPIC - Abnormal; Notable for the following components:      Result Value   APPearance HAZY (*)    Hgb urine dipstick SMALL (*)    Ketones, ur 80 (*)    Leukocytes, UA MODERATE (*)    Bacteria, UA RARE (*)    All other components within normal limits  BASIC METABOLIC PANEL - Abnormal; Notable for the following components:   Chloride 100 (*)    Glucose, Bld 147 (*)    All other components within normal limits  CULTURE, BLOOD (ROUTINE X 2)  CULTURE, BLOOD (ROUTINE X 2)  URINE CULTURE  CBC WITH DIFFERENTIAL/PLATELET    Course of Care:   Physical Exam  Constitutional: She appears well-developed and well-nourished. No distress.  Sitting comfortably in bed.  HENT:  Head: Normocephalic and atraumatic.  Eyes: Conjunctivae are normal. Right eye exhibits no discharge. Left eye exhibits no discharge.  EOMs normal to gross examination.  Neck: Normal range of motion.  Cardiovascular: Normal rate and regular rhythm.  Intact, 2+ radial pulse bilaterally.   Pulmonary/Chest:  Normal respiratory effort. Patient converses comfortably. No audible wheeze or stridor.  Abdominal: She exhibits no distension.  Musculoskeletal: Normal range of motion.  PALPATION: No midline but paraspinal musculature tenderness of cervical and thoracic spine. ROM of cervical spine intact with flexion/extension/lateral flexion/lateral rotation; Patient can laterally rotate cervical spine greater than 45 degrees.  MOTOR: 5/5 strength b/l with resisted shoulder abduction/adduction, biceps flexion (C5/6), biceps extension (C6-C8), wrist flexion, wrist extension (C6-C8), and grip strength (C7-T1) 2+ DTRs in the biceps and triceps SENSORY: Sensation is intact to light touch in:  Superficial radial nerve distribution (dorsal first web space) Median nerve distribution (tip of index finger)   Ulnar nerve distribution (tip of small finger)  Patient moves LEs symmetrically and with good coordination. Patient ambulates symmetrically with no evidence of LE weakness. Normal and symmetric gait.  Neurological: She is alert.  Cranial nerves intact to gross observation. Patient moves extremities without difficulty.  Skin: Skin is warm and dry. She is not diaphoretic.  Psychiatric: She has a normal mood and affect. Her behavior is normal. Judgment and thought content normal.  Nursing note and vitals reviewed.   ED Course/Procedures   Clinical Course as of Oct 20 798  Mon Oct 19, 2017  0307 MRI reviewed and shows severe facet arthritis C4-C5 with reactive marrow edema, possibly source of pain.   [AM]  Clinical Course User Index [AM] Albesa Seen, PA-C    Procedures  MDM   Patient nontoxic-appearing and in no acute distress.  MRI demonstrates diffuse degenerative changes of the cervical spine, as well as reactive marrow edema near the right C4-C5 facet arthritis, as possible cause of patient's pain.  No epidural abscess.  Patient neurologically intact at discharge, and  reports symptomatic improvement.  Short course of Norco prescribed, and patient instructed to follow-up with primary care provider to discuss further management options.  Neurosurgical referral also provided, and discussed with patient to follow-up with primary care provider regarding different options for management of cervical arthritis.  Patient and her son are in understanding and agree with the plan of care.    Albesa Seen, PA-C 10/19/17 0813    Orpah Greek, MD 10/20/17 [phone]

## 2017-10-20 LAB — URINE CULTURE
Culture: <10000 — AB
Special Requests: NORMAL

## 2017-10-23 DIAGNOSIS — N390 Urinary tract infection, site not specified: Secondary | ICD-10-CM | POA: Diagnosis not present

## 2017-10-23 DIAGNOSIS — I10 Essential (primary) hypertension: Secondary | ICD-10-CM | POA: Diagnosis not present

## 2017-10-23 DIAGNOSIS — M5412 Radiculopathy, cervical region: Secondary | ICD-10-CM | POA: Diagnosis not present

## 2017-10-23 DIAGNOSIS — M4802 Spinal stenosis, cervical region: Secondary | ICD-10-CM | POA: Diagnosis not present

## 2017-10-23 LAB — CULTURE, BLOOD (ROUTINE X 2)
Culture: NO GROWTH
Culture: NO GROWTH
Special Requests: ADEQUATE
Special Requests: ADEQUATE

## 2017-11-25 DIAGNOSIS — M47812 Spondylosis without myelopathy or radiculopathy, cervical region: Secondary | ICD-10-CM | POA: Diagnosis not present

## 2017-12-07 ENCOUNTER — Encounter: Payer: Self-pay | Admitting: Internal Medicine

## 2017-12-24 DIAGNOSIS — I10 Essential (primary) hypertension: Secondary | ICD-10-CM | POA: Diagnosis not present

## 2018-02-02 ENCOUNTER — Telehealth: Payer: Self-pay

## 2018-02-02 NOTE — Telephone Encounter (Signed)
-----   Message from Daneil Dolin, MD sent at 02/02/2018  3:21 PM EDT ----- Time marches on.  She is now 63; serrated polyp without dysplasia 3 years ago.  I think we can avoid a repat colonoscopy now due mainly to age (unless she is having problems) ----- Message ----- From: Claudina Lick, LPN Sent: 3/70/2301   4:32 PM EDT To: Daneil Dolin, MD  Hi Dr.Rourk, this pt is on the schedule next week to triage for a 3 year colonoscopy. She is 82. Do you want her to have this done?

## 2018-02-02 NOTE — Telephone Encounter (Signed)
I called the pt and explained everything to her. Pt stated she was not having any problems and was feeling well and she did not want to have this done at this time. She is aware that if she were to having any problems she can call us. Pts nurse visit has been cancelled.

## 2018-02-04 ENCOUNTER — Ambulatory Visit: Payer: Medicare Other

## 2018-03-15 ENCOUNTER — Other Ambulatory Visit: Payer: Self-pay

## 2018-03-15 NOTE — Patient Outreach (Signed)
[place]) Care Management  03/15/2018  OLA RAAP 07/07/1935 [id number]   Medication Adherence call to [name] left a message for patient to call back patient is due on Lisinopril /HCTZ 20/12.5 mg under [place].  [place] Management Direct Dial [phone]  Fax [phone] [email or web address]

## 2018-03-25 DIAGNOSIS — M810 Age-related osteoporosis without current pathological fracture: Secondary | ICD-10-CM | POA: Diagnosis not present

## 2018-03-25 DIAGNOSIS — M1990 Unspecified osteoarthritis, unspecified site: Secondary | ICD-10-CM | POA: Diagnosis not present

## 2018-03-25 DIAGNOSIS — I10 Essential (primary) hypertension: Secondary | ICD-10-CM | POA: Diagnosis not present

## 2018-03-25 DIAGNOSIS — Z23 Encounter for immunization: Secondary | ICD-10-CM | POA: Diagnosis not present

## 2018-06-24 DIAGNOSIS — E785 Hyperlipidemia, unspecified: Secondary | ICD-10-CM | POA: Diagnosis not present

## 2018-06-24 DIAGNOSIS — Z0001 Encounter for general adult medical examination with abnormal findings: Secondary | ICD-10-CM | POA: Diagnosis not present

## 2018-06-24 DIAGNOSIS — I10 Essential (primary) hypertension: Secondary | ICD-10-CM | POA: Diagnosis not present

## 2018-06-24 DIAGNOSIS — Z1389 Encounter for screening for other disorder: Secondary | ICD-10-CM | POA: Diagnosis not present

## 2018-06-24 DIAGNOSIS — M810 Age-related osteoporosis without current pathological fracture: Secondary | ICD-10-CM | POA: Diagnosis not present

## 2018-06-24 DIAGNOSIS — Z0000 Encounter for general adult medical examination without abnormal findings: Secondary | ICD-10-CM | POA: Diagnosis not present

## 2018-06-28 ENCOUNTER — Other Ambulatory Visit: Payer: Self-pay

## 2018-06-28 NOTE — Patient Outreach (Signed)
[place] [facility]) Care Management  06/28/2018  JULIANY DAUGHETY 12/01/1935 [id number]   Medication Adherence call to [name] patient did not answer patient is due on Simvastatin 40 mg and Lisinopril/ HCTZ 20/12.5 mg.Mrs. Winegar is showing past due under [place].   [place] Management Direct Dial [phone]  Fax [phone] [email or web address]

## 2018-08-12 ENCOUNTER — Other Ambulatory Visit (HOSPITAL_COMMUNITY): Payer: Self-pay | Admitting: Internal Medicine

## 2018-08-12 DIAGNOSIS — Z1231 Encounter for screening mammogram for malignant neoplasm of breast: Secondary | ICD-10-CM

## 2018-08-19 ENCOUNTER — Encounter (HOSPITAL_COMMUNITY): Payer: Self-pay

## 2018-08-19 ENCOUNTER — Other Ambulatory Visit: Payer: Self-pay

## 2018-08-19 ENCOUNTER — Ambulatory Visit (HOSPITAL_COMMUNITY)
Admission: RE | Admit: 2018-08-19 | Discharge: 2018-08-19 | Disposition: A | Discharge status: Home / Self Care | Payer: Medicare Other | Source: Ambulatory Visit | Attending: Internal Medicine | Admitting: Internal Medicine

## 2018-08-19 DIAGNOSIS — Z1231 Encounter for screening mammogram for malignant neoplasm of breast: Secondary | ICD-10-CM | POA: Insufficient documentation

## 2018-10-06 DIAGNOSIS — M549 Dorsalgia, unspecified: Secondary | ICD-10-CM | POA: Diagnosis not present

## 2018-10-06 DIAGNOSIS — M810 Age-related osteoporosis without current pathological fracture: Secondary | ICD-10-CM | POA: Diagnosis not present

## 2018-10-06 DIAGNOSIS — E785 Hyperlipidemia, unspecified: Secondary | ICD-10-CM | POA: Diagnosis not present

## 2018-10-06 DIAGNOSIS — I10 Essential (primary) hypertension: Secondary | ICD-10-CM | POA: Diagnosis not present

## 2019-01-05 DIAGNOSIS — E785 Hyperlipidemia, unspecified: Secondary | ICD-10-CM | POA: Diagnosis not present

## 2019-01-05 DIAGNOSIS — I10 Essential (primary) hypertension: Secondary | ICD-10-CM | POA: Diagnosis not present

## 2019-01-05 DIAGNOSIS — M1990 Unspecified osteoarthritis, unspecified site: Secondary | ICD-10-CM | POA: Diagnosis not present

## 2019-01-05 DIAGNOSIS — M810 Age-related osteoporosis without current pathological fracture: Secondary | ICD-10-CM | POA: Diagnosis not present

## 2019-02-05 DIAGNOSIS — E785 Hyperlipidemia, unspecified: Secondary | ICD-10-CM | POA: Diagnosis not present

## 2019-02-05 DIAGNOSIS — I10 Essential (primary) hypertension: Secondary | ICD-10-CM | POA: Diagnosis not present

## 2019-03-07 DIAGNOSIS — M549 Dorsalgia, unspecified: Secondary | ICD-10-CM | POA: Diagnosis not present

## 2019-03-07 DIAGNOSIS — I10 Essential (primary) hypertension: Secondary | ICD-10-CM | POA: Diagnosis not present

## 2019-04-07 DIAGNOSIS — M549 Dorsalgia, unspecified: Secondary | ICD-10-CM | POA: Diagnosis not present

## 2019-04-07 DIAGNOSIS — E785 Hyperlipidemia, unspecified: Secondary | ICD-10-CM | POA: Diagnosis not present

## 2019-05-03 DIAGNOSIS — I10 Essential (primary) hypertension: Secondary | ICD-10-CM | POA: Diagnosis not present

## 2019-05-03 DIAGNOSIS — M810 Age-related osteoporosis without current pathological fracture: Secondary | ICD-10-CM | POA: Diagnosis not present

## 2019-05-03 DIAGNOSIS — M1990 Unspecified osteoarthritis, unspecified site: Secondary | ICD-10-CM | POA: Diagnosis not present

## 2019-05-03 DIAGNOSIS — E785 Hyperlipidemia, unspecified: Secondary | ICD-10-CM | POA: Diagnosis not present

## 2019-05-25 ENCOUNTER — Other Ambulatory Visit: Payer: Self-pay

## 2019-05-25 DIAGNOSIS — Z719 Counseling, unspecified: Secondary | ICD-10-CM | POA: Diagnosis not present

## 2019-05-25 NOTE — Patient Outreach (Signed)
[place] [facility]) Care Management  05/25/2019  DEANIE FARIES Jul 30, 1935 [id number]   Medication Adherence call to Mrs. [place] Compliant Voice message left with a call back number. Mrs. Khang is showing past due on Lisinopril/Hctz 20/12.5 mg under [place].   [place] Management Direct Dial [phone]  Fax [phone] [email or web address]

## 2019-06-01 ENCOUNTER — Other Ambulatory Visit: Payer: Self-pay

## 2019-06-01 NOTE — Patient Outreach (Signed)
[facility]) Care Management  06/01/2019  Tanya Daniel [birth year] [id number]   Medication Adherence call to [name] [place] Identifiers Verify spoke with patient she is past due on Lisinopril/Hctz 20/12.5 mg,patient explain she takes 1 tablet daily,she explain she has a few,patient ask to call [facility] to order this medication ,[facility] will [facility] it [facility] for pt to pick up. [name] is showing past due under [place].   [place] Management Direct Dial [phone]  Fax [phone] [email or web address]

## 2019-06-03 DIAGNOSIS — I10 Essential (primary) hypertension: Secondary | ICD-10-CM | POA: Diagnosis not present

## 2019-06-03 DIAGNOSIS — M549 Dorsalgia, unspecified: Secondary | ICD-10-CM | POA: Diagnosis not present

## 2019-06-09 ENCOUNTER — Other Ambulatory Visit: Payer: Self-pay

## 2019-06-09 NOTE — Patient Outreach (Signed)
[place] [facility]) Care Management  06/09/2019  Tanya Daniel Feb 06, 1936 [id number]   Medication Adherence call to [name] Hippa Identifiers Verify spoke with patient she is past due on Lisinopril/Hctz 20/12.5 mg,patient explain she takes 1 tablet daily ,patient has pick up from [facility] and has plenty for a 90 days supply. [name] is showing past due under [place].   [place] Management Direct Dial [phone]  Fax [phone] [email or web address]

## 2019-07-28 DIAGNOSIS — Z1389 Encounter for screening for other disorder: Secondary | ICD-10-CM | POA: Diagnosis not present

## 2019-07-28 DIAGNOSIS — M549 Dorsalgia, unspecified: Secondary | ICD-10-CM | POA: Diagnosis not present

## 2019-07-28 DIAGNOSIS — Z0001 Encounter for general adult medical examination with abnormal findings: Secondary | ICD-10-CM | POA: Diagnosis not present

## 2019-07-28 DIAGNOSIS — E785 Hyperlipidemia, unspecified: Secondary | ICD-10-CM | POA: Diagnosis not present

## 2019-07-28 DIAGNOSIS — I10 Essential (primary) hypertension: Secondary | ICD-10-CM | POA: Diagnosis not present

## 2019-08-05 DIAGNOSIS — I10 Essential (primary) hypertension: Secondary | ICD-10-CM | POA: Diagnosis not present

## 2019-08-05 DIAGNOSIS — Z0001 Encounter for general adult medical examination with abnormal findings: Secondary | ICD-10-CM | POA: Diagnosis not present

## 2019-08-05 DIAGNOSIS — E785 Hyperlipidemia, unspecified: Secondary | ICD-10-CM | POA: Diagnosis not present

## 2019-08-25 DIAGNOSIS — E785 Hyperlipidemia, unspecified: Secondary | ICD-10-CM | POA: Diagnosis not present

## 2019-08-25 DIAGNOSIS — I10 Essential (primary) hypertension: Secondary | ICD-10-CM | POA: Diagnosis not present

## 2019-09-12 DIAGNOSIS — M79604 Pain in right leg: Secondary | ICD-10-CM | POA: Diagnosis not present

## 2019-09-12 DIAGNOSIS — I10 Essential (primary) hypertension: Secondary | ICD-10-CM | POA: Diagnosis not present

## 2019-10-12 DIAGNOSIS — M549 Dorsalgia, unspecified: Secondary | ICD-10-CM | POA: Diagnosis not present

## 2019-10-12 DIAGNOSIS — I10 Essential (primary) hypertension: Secondary | ICD-10-CM | POA: Diagnosis not present

## 2019-11-12 DIAGNOSIS — E785 Hyperlipidemia, unspecified: Secondary | ICD-10-CM | POA: Diagnosis not present

## 2019-11-12 DIAGNOSIS — I10 Essential (primary) hypertension: Secondary | ICD-10-CM | POA: Diagnosis not present

## 2020-01-12 DIAGNOSIS — M810 Age-related osteoporosis without current pathological fracture: Secondary | ICD-10-CM | POA: Diagnosis not present

## 2020-01-12 DIAGNOSIS — I10 Essential (primary) hypertension: Secondary | ICD-10-CM | POA: Diagnosis not present

## 2020-01-12 DIAGNOSIS — M1990 Unspecified osteoarthritis, unspecified site: Secondary | ICD-10-CM | POA: Diagnosis not present

## 2020-02-12 DIAGNOSIS — I10 Essential (primary) hypertension: Secondary | ICD-10-CM | POA: Diagnosis not present

## 2020-02-12 DIAGNOSIS — M549 Dorsalgia, unspecified: Secondary | ICD-10-CM | POA: Diagnosis not present

## 2020-03-13 DIAGNOSIS — I10 Essential (primary) hypertension: Secondary | ICD-10-CM | POA: Diagnosis not present

## 2020-03-13 DIAGNOSIS — M1990 Unspecified osteoarthritis, unspecified site: Secondary | ICD-10-CM | POA: Diagnosis not present

## 2020-03-16 DIAGNOSIS — Z23 Encounter for immunization: Secondary | ICD-10-CM | POA: Diagnosis not present

## 2020-04-13 DIAGNOSIS — I10 Essential (primary) hypertension: Secondary | ICD-10-CM | POA: Diagnosis not present

## 2020-04-13 DIAGNOSIS — E785 Hyperlipidemia, unspecified: Secondary | ICD-10-CM | POA: Diagnosis not present

## 2020-05-13 DIAGNOSIS — E785 Hyperlipidemia, unspecified: Secondary | ICD-10-CM | POA: Diagnosis not present

## 2020-05-13 DIAGNOSIS — I10 Essential (primary) hypertension: Secondary | ICD-10-CM | POA: Diagnosis not present

## 2020-06-19 DIAGNOSIS — E785 Hyperlipidemia, unspecified: Secondary | ICD-10-CM | POA: Diagnosis not present

## 2020-06-19 DIAGNOSIS — I10 Essential (primary) hypertension: Secondary | ICD-10-CM | POA: Diagnosis not present

## 2020-07-12 DIAGNOSIS — M810 Age-related osteoporosis without current pathological fracture: Secondary | ICD-10-CM | POA: Diagnosis not present

## 2020-07-12 DIAGNOSIS — I10 Essential (primary) hypertension: Secondary | ICD-10-CM | POA: Diagnosis not present

## 2020-07-12 DIAGNOSIS — Z1389 Encounter for screening for other disorder: Secondary | ICD-10-CM | POA: Diagnosis not present

## 2020-07-12 DIAGNOSIS — Z0001 Encounter for general adult medical examination with abnormal findings: Secondary | ICD-10-CM | POA: Diagnosis not present

## 2020-07-12 DIAGNOSIS — E785 Hyperlipidemia, unspecified: Secondary | ICD-10-CM | POA: Diagnosis not present

## 2020-07-13 DIAGNOSIS — I10 Essential (primary) hypertension: Secondary | ICD-10-CM | POA: Diagnosis not present

## 2020-07-13 DIAGNOSIS — Z0001 Encounter for general adult medical examination with abnormal findings: Secondary | ICD-10-CM | POA: Diagnosis not present

## 2020-09-17 DIAGNOSIS — E785 Hyperlipidemia, unspecified: Secondary | ICD-10-CM | POA: Diagnosis not present

## 2020-09-17 DIAGNOSIS — I10 Essential (primary) hypertension: Secondary | ICD-10-CM | POA: Diagnosis not present

## 2020-10-01 DIAGNOSIS — M25542 Pain in joints of left hand: Secondary | ICD-10-CM | POA: Diagnosis not present

## 2020-10-01 DIAGNOSIS — M549 Dorsalgia, unspecified: Secondary | ICD-10-CM | POA: Diagnosis not present

## 2020-10-01 DIAGNOSIS — I10 Essential (primary) hypertension: Secondary | ICD-10-CM | POA: Diagnosis not present

## 2020-10-02 ENCOUNTER — Other Ambulatory Visit (HOSPITAL_COMMUNITY): Payer: Self-pay | Admitting: Internal Medicine

## 2020-10-02 ENCOUNTER — Ambulatory Visit (HOSPITAL_COMMUNITY)
Admission: RE | Admit: 2020-10-02 | Discharge: 2020-10-02 | Disposition: A | Discharge status: Home / Self Care | Payer: Medicare Other | Source: Ambulatory Visit | Attending: Internal Medicine | Admitting: Internal Medicine

## 2020-10-02 DIAGNOSIS — M79642 Pain in left hand: Secondary | ICD-10-CM | POA: Diagnosis not present

## 2020-10-09 DIAGNOSIS — E785 Hyperlipidemia, unspecified: Secondary | ICD-10-CM | POA: Diagnosis not present

## 2020-10-09 DIAGNOSIS — I10 Essential (primary) hypertension: Secondary | ICD-10-CM | POA: Diagnosis not present

## 2020-10-09 DIAGNOSIS — R739 Hyperglycemia, unspecified: Secondary | ICD-10-CM | POA: Diagnosis not present

## 2020-10-09 DIAGNOSIS — Z0001 Encounter for general adult medical examination with abnormal findings: Secondary | ICD-10-CM | POA: Diagnosis not present

## 2020-10-24 IMAGING — MG DIGITAL SCREENING BILATERAL MAMMOGRAM WITH TOMO AND CAD
6 of 12 series · 6 of 36 positions shown · non-contrast
Comparison: Previous exam(s).

CLINICAL DATA: Screening.

EXAM:
DIGITAL SCREENING BILATERAL MAMMOGRAM WITH TOMO AND CAD

[L MLO synth-2D (1 of 2)]
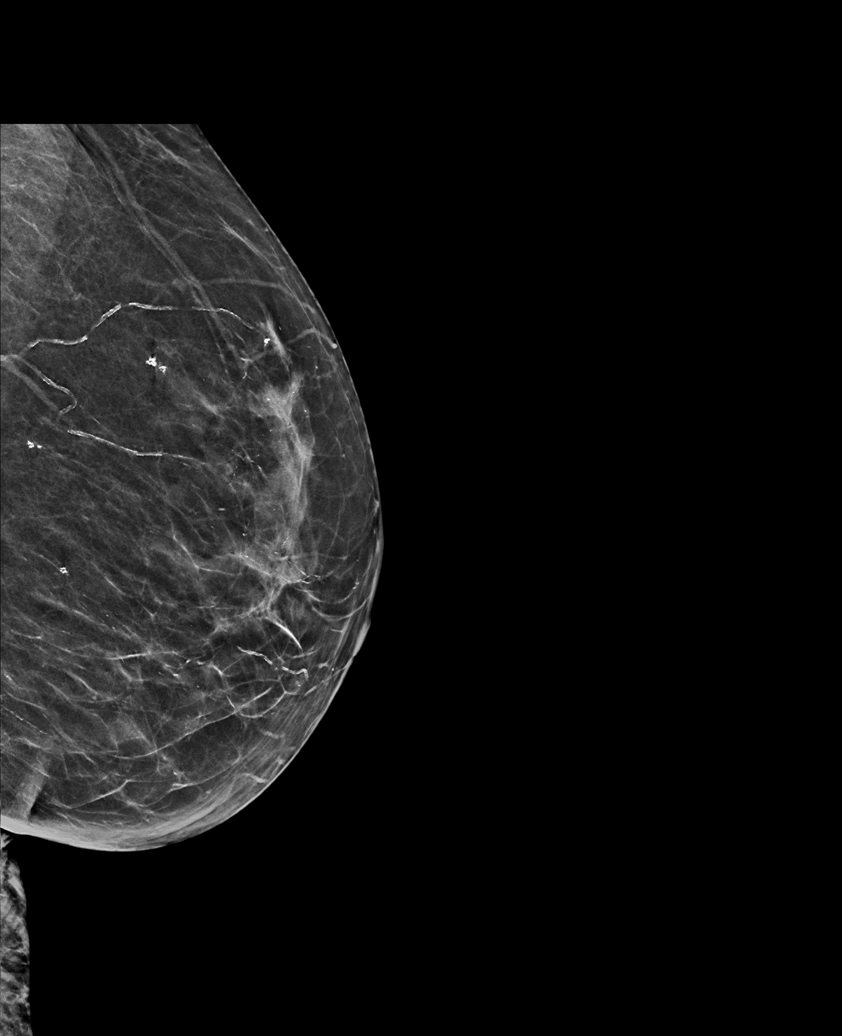

[L CC synth-2D]
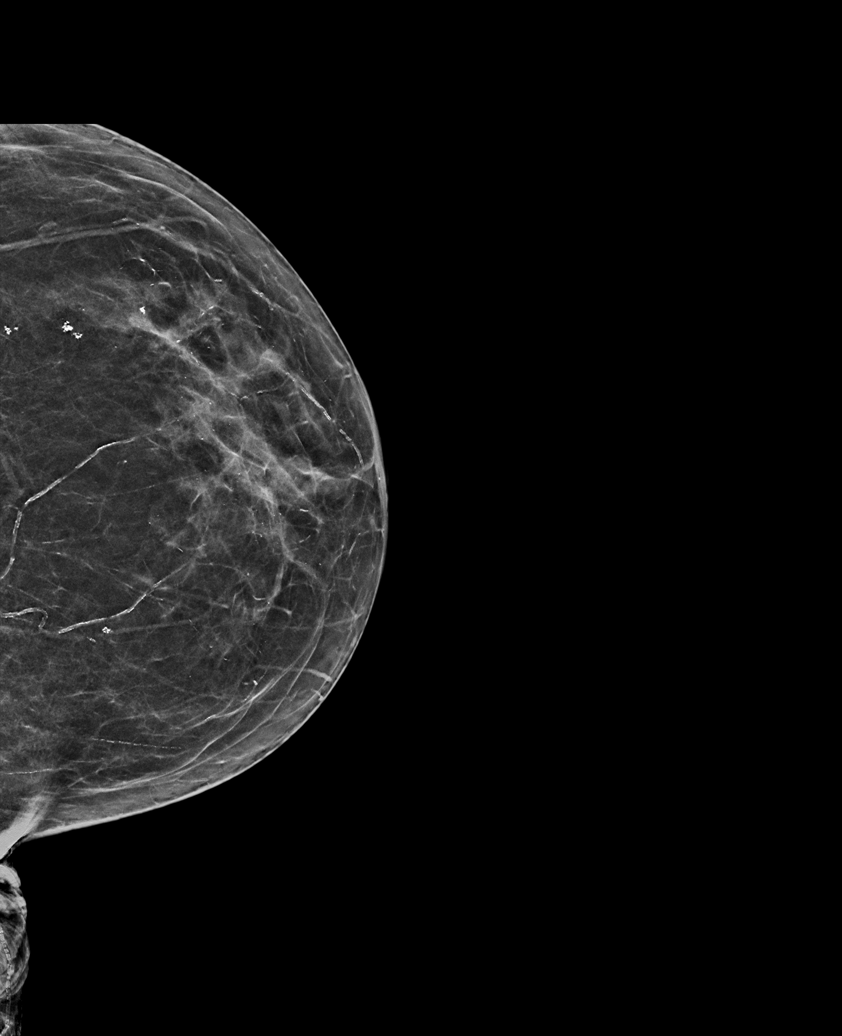

[R MLO synth-2D (1 of 2)]
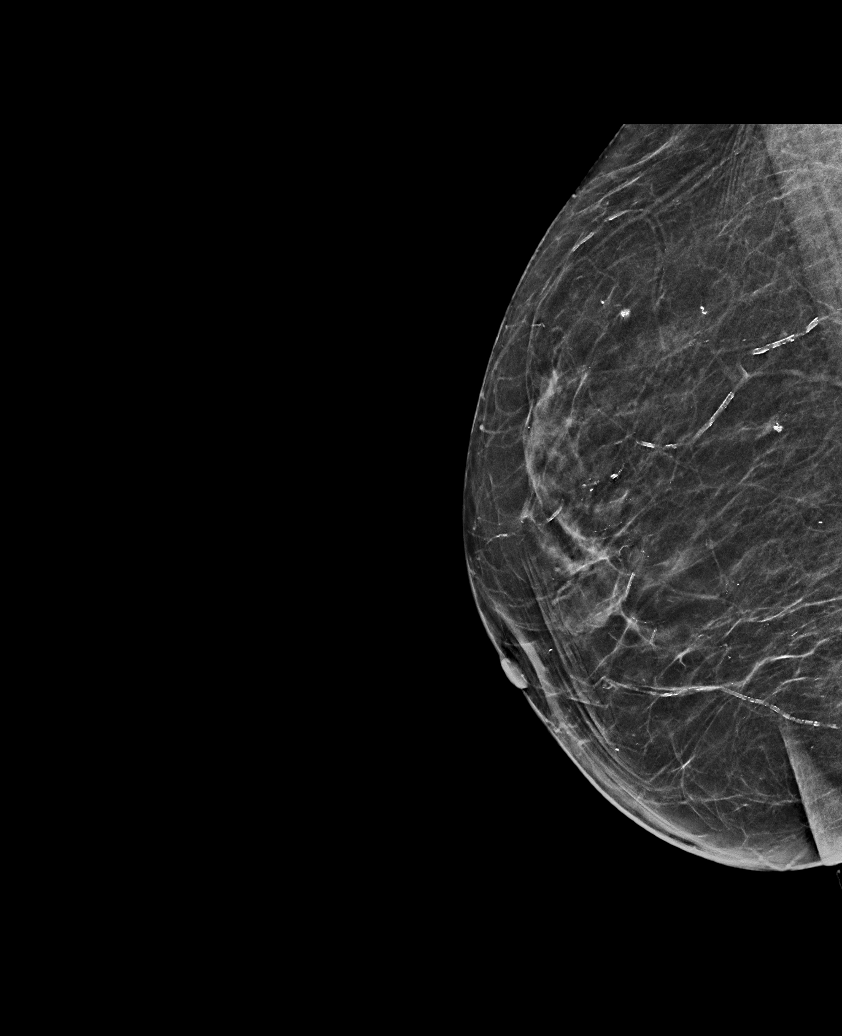

[L MLO synth-2D (2 of 2)]
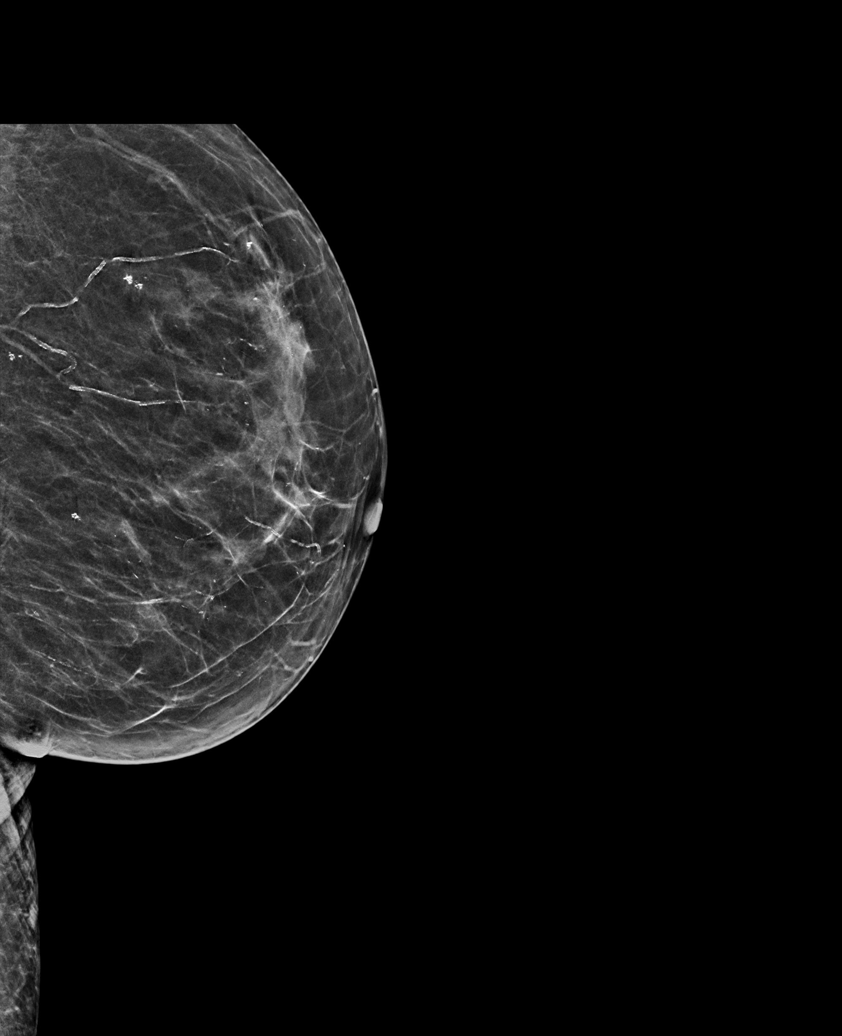

[R CC synth-2D]
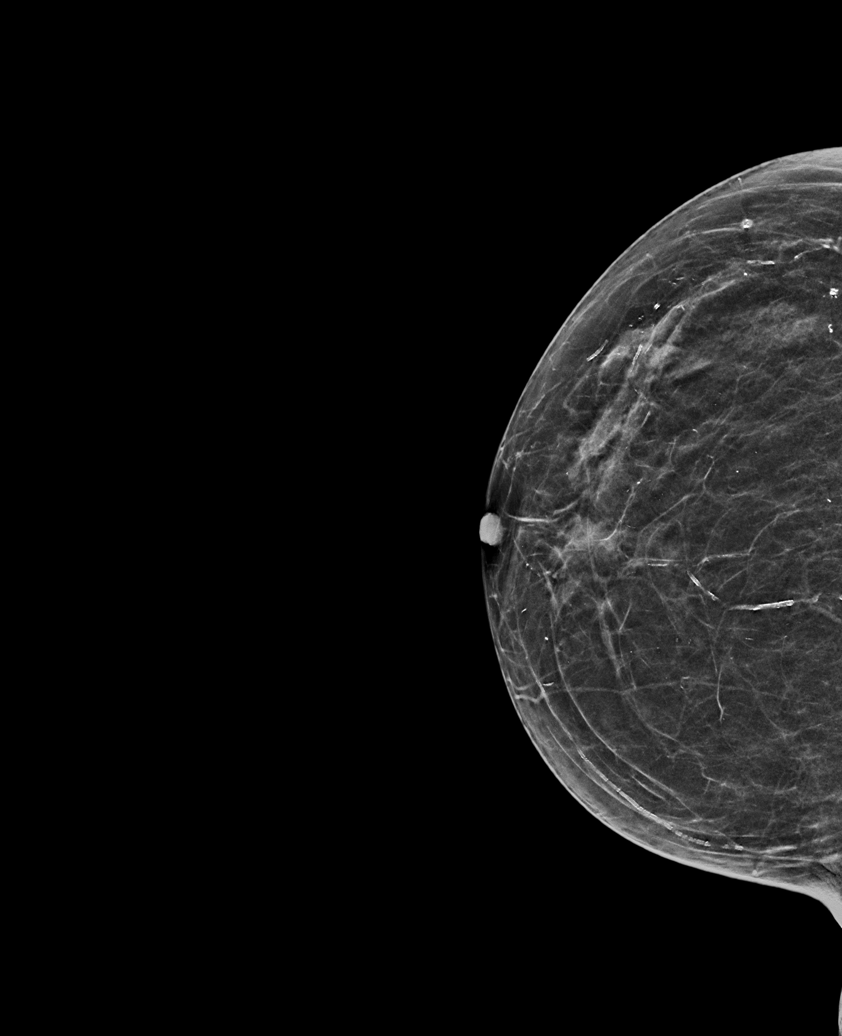

[R MLO synth-2D (2 of 2)]
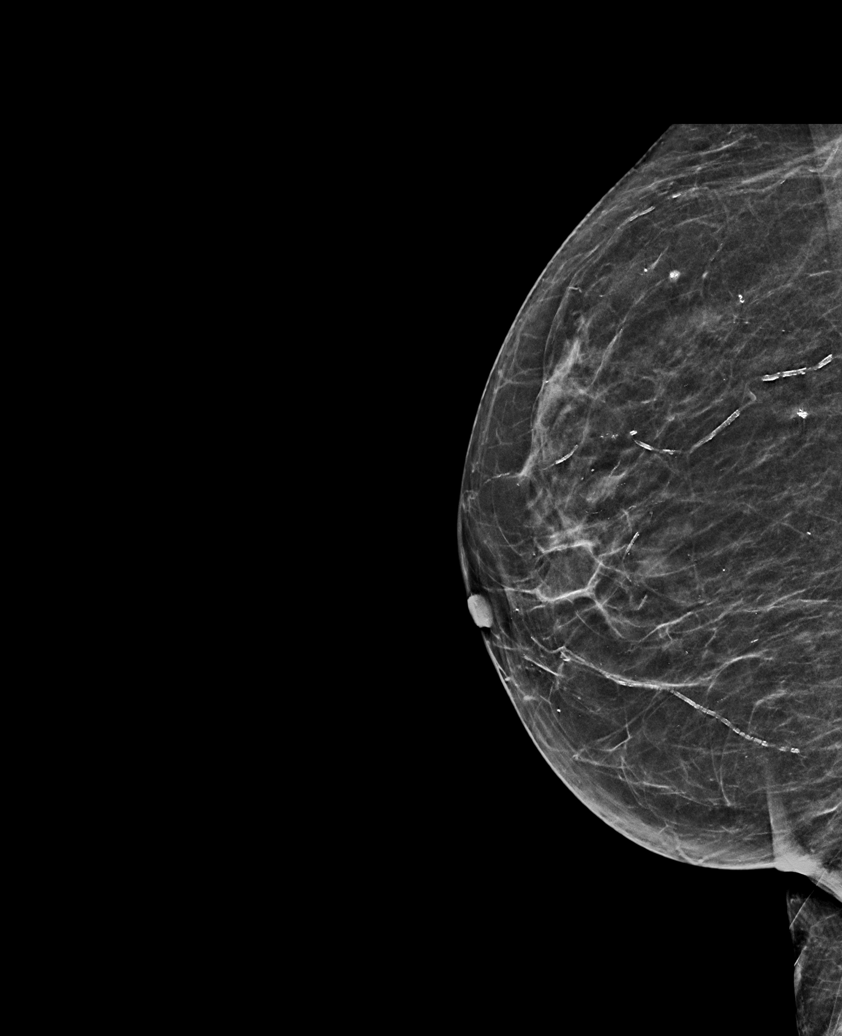

[6 of 36 positions shown; findings below may reference images not displayed]

ACR Breast Density Category b: There are scattered areas of
fibroglandular density.
FINDINGS: There are no findings suspicious for malignancy. Images were
processed with CAD.
IMPRESSION: No mammographic evidence of malignancy. A result letter of this
screening mammogram will be mailed directly to the patient.

RECOMMENDATION:
Screening mammogram in one year. (Code:[id number])

BI-RADS CATEGORY  1: Negative.

## 2020-11-01 DIAGNOSIS — I10 Essential (primary) hypertension: Secondary | ICD-10-CM | POA: Diagnosis not present

## 2020-11-01 DIAGNOSIS — E785 Hyperlipidemia, unspecified: Secondary | ICD-10-CM | POA: Diagnosis not present

## 2020-12-01 DIAGNOSIS — E785 Hyperlipidemia, unspecified: Secondary | ICD-10-CM | POA: Diagnosis not present

## 2020-12-01 DIAGNOSIS — I10 Essential (primary) hypertension: Secondary | ICD-10-CM | POA: Diagnosis not present

## 2021-01-01 DIAGNOSIS — M810 Age-related osteoporosis without current pathological fracture: Secondary | ICD-10-CM | POA: Diagnosis not present

## 2021-01-01 DIAGNOSIS — I10 Essential (primary) hypertension: Secondary | ICD-10-CM | POA: Diagnosis not present

## 2021-01-01 DIAGNOSIS — M19042 Primary osteoarthritis, left hand: Secondary | ICD-10-CM | POA: Diagnosis not present

## 2021-01-01 DIAGNOSIS — E785 Hyperlipidemia, unspecified: Secondary | ICD-10-CM | POA: Diagnosis not present

## 2021-02-01 DIAGNOSIS — M19042 Primary osteoarthritis, left hand: Secondary | ICD-10-CM | POA: Diagnosis not present

## 2021-02-01 DIAGNOSIS — I10 Essential (primary) hypertension: Secondary | ICD-10-CM | POA: Diagnosis not present

## 2021-02-16 ENCOUNTER — Encounter (HOSPITAL_COMMUNITY): Payer: Self-pay

## 2021-02-16 ENCOUNTER — Other Ambulatory Visit: Payer: Self-pay

## 2021-02-16 ENCOUNTER — Emergency Department (HOSPITAL_COMMUNITY)
Admission: EM | Admit: 2021-02-16 | Discharge: 2021-02-16 | Disposition: A | Discharge status: Home / Self Care | Payer: Medicare Other | Attending: Emergency Medicine | Admitting: Emergency Medicine

## 2021-02-16 DIAGNOSIS — M533 Sacrococcygeal disorders, not elsewhere classified: Secondary | ICD-10-CM | POA: Diagnosis not present

## 2021-02-16 DIAGNOSIS — Z7982 Long term (current) use of aspirin: Secondary | ICD-10-CM | POA: Diagnosis not present

## 2021-02-16 DIAGNOSIS — I10 Essential (primary) hypertension: Secondary | ICD-10-CM | POA: Insufficient documentation

## 2021-02-16 DIAGNOSIS — M461 Sacroiliitis, not elsewhere classified: Secondary | ICD-10-CM

## 2021-02-16 DIAGNOSIS — Z79899 Other long term (current) drug therapy: Secondary | ICD-10-CM | POA: Insufficient documentation

## 2021-02-16 DIAGNOSIS — M549 Dorsalgia, unspecified: Secondary | ICD-10-CM | POA: Diagnosis present

## 2021-02-16 MED ORDER — ACETAMINOPHEN 325 MG PO TABS
650.0000 mg | ORAL_TABLET | Freq: Once | ORAL | Status: AC
  Administered 2021-02-16: 650 mg via ORAL
  Filled 2021-02-16: qty 2

## 2021-02-16 MED ORDER — LIDOCAINE 4 % EX PTCH: 1.0000 | MEDICATED_PATCH | Freq: Two times a day (BID) | CUTANEOUS | 0 refills | Status: AC

## 2021-02-16 MED ORDER — NAPROXEN 250 MG PO TABS
250.0000 mg | ORAL_TABLET | Freq: Once | ORAL | Status: AC
Start: 2021-02-16 — End: 2021-02-16
  Administered 2021-02-16: 250 mg via ORAL
  Filled 2021-02-16: qty 1

## 2021-02-16 MED ORDER — ACETAMINOPHEN 500 MG PO TABS: 500.0000 mg | ORAL_TABLET | Freq: Four times a day (QID) | ORAL | 0 refills | Status: AC | PRN

## 2021-02-16 MED ORDER — NAPROXEN SODIUM 220 MG PO TABS: 220.0000 mg | ORAL_TABLET | Freq: Two times a day (BID) | ORAL | 0 refills | Status: AC

## 2021-02-16 MED ORDER — LIDOCAINE 5 % EX PTCH
1.0000 | MEDICATED_PATCH | CUTANEOUS | Status: DC
  Administered 2021-02-16: 1 via TRANSDERMAL
  Filled 2021-02-16: qty 1

## 2021-02-16 NOTE — ED Triage Notes (Signed)
Pt ambulatory to er room number 12, pt states that she was in her recliner a couple of days ago and got up and had a sudden onset of L flank/back pain. States that nothing seems to make it better or worse.

## 2021-02-16 NOTE — Discharge Instructions (Addendum)
You were seen in the ER for back pain.  It appears that your pain is because of strain of one of the muscles of the hip.  The treatment is conservative and includes heat and warm compresses along with simple stretching exercises and anti-inflammatory medications.  Take the medications prescribed.  Try to perform the stretching exercises printed for you -only attempt to do exercises that you are able to.  See your primary care doctor in 5 to 7 days if not improving.

## 2021-02-16 NOTE — ED Provider Notes (Signed)
[facility] EMERGENCY DEPARTMENT Provider Note   CSN: [id number] Arrival date & time: 02/16/21  [id number]     History Chief Complaint  Patient presents with   Back Pain    Tanya Daniel is a 85 y.o. female.  HPI     Pt comes in with sudden onset back pain. Back pain is sharp, constant - with waxing and waning intensity. No specific aggravating or relieving factors.   Pt has no associated numbness, weakness, urinary incontinence, urinary retention, bowel incontinence, pins and needle sensation in the perineal area.  No hx of back pain.   Past Medical History:  Diagnosis Date   High cholesterol    HTN (hypertension)     There are no problems to display for this patient.   Past Surgical History:  Procedure Laterality Date   ABDOMINAL HYSTERECTOMY     COLONOSCOPY     COLONOSCOPY N/A 01/22/2015   Procedure: COLONOSCOPY;  Surgeon: Daneil Dolin, MD;  Location: [facility] SUITE;  Service: Endoscopy;  Laterality: N/A;  1:00 PM     OB History   No obstetric history on file.     History reviewed. No pertinent family history.  Social History   Tobacco Use   Smoking status: Never   Smokeless tobacco: Never  Vaping Use   Vaping Use: Never used  Substance Use Topics   Alcohol use: No   Drug use: No    Home Medications Prior to Admission medications   Medication Sig Start Date End Date Taking? Authorizing Provider  acetaminophen (TYLENOL) 500 MG tablet Take 1 tablet (500 mg total) by mouth every 6 (six) hours as needed. 02/16/21  Yes Jarvis Sawa, MD  Lidocaine 4 % PTCH Apply 1 patch topically 2 (two) times daily. 02/16/21  Yes Varney Biles, MD  naproxen sodium (ALEVE) 220 MG tablet Take 1 tablet (220 mg total) by mouth 2 (two) times daily with a meal. 02/16/21  Yes Buster Schueller, MD  alendronate (FOSAMAX) 70 MG tablet Take 70 mg by mouth once a week. Take with a full glass of water on an empty stomach.    [provider]  amLODipine (NORVASC) 5 MG tablet  Take 1 tablet by mouth daily. 09/25/17   [provider]  Ascorbic Acid (VITAMIN C PO) Take by mouth.    [provider]  aspirin EC 81 MG tablet Take 81 mg by mouth daily.    [provider]  Calcium Carb-Cholecalciferol (CALCIUM + D3 PO) Take by mouth.    [provider]  HYDROcodone-acetaminophen (NORCO/VICODIN) 5-325 MG tablet Take 0.5 tablets by mouth every 6 (six) hours as needed. 10/19/17   Langston Masker B, PA-C  lisinopril-hydrochlorothiazide (PRINZIDE,ZESTORETIC) 10-12.5 MG per tablet Take 1 tablet by mouth daily.    [provider]  Omega-3 Fatty Acids (FISH OIL) 1000 MG CAPS Take 1 capsule by mouth 3 (three) times daily.     [provider]  simvastatin (ZOCOR) 40 MG tablet Take 40 mg by mouth daily.    [provider]  traMADol (ULTRAM) 50 MG tablet Take 50 mg by mouth every 6 (six) hours as needed for moderate pain.     [provider]    Allergies    Patient has no known allergies.  Review of Systems   Review of Systems  Constitutional:  Positive for activity change.  Gastrointestinal:  Negative for abdominal pain.  Genitourinary:  Negative for dysuria.  Musculoskeletal:  Positive for back pain.  Neurological:  Negative for numbness.   Physical Exam Updated Vital Signs BP 132/67 (BP Location: Left Arm)   Pulse (!) 108   Temp 99.7 F (37.6 C) (Oral)   Resp 18   Ht '5\' 1"'$  (1.549 m)   Wt 59 kg   SpO2 96%   BMI 24.56 kg/m   Physical Exam Vitals and nursing note reviewed.  Constitutional:      Appearance: She is well-developed.  HENT:     Head: Atraumatic.  Cardiovascular:     Rate and Rhythm: Normal rate.  Pulmonary:     Effort: Pulmonary effort is normal.  Abdominal:     Palpations: There is no mass.     Tenderness: There is no abdominal tenderness.  Musculoskeletal:     Cervical back: Normal range of motion and neck supple.     Comments: Patient has tenderness over the left sacroiliac  region.  No midline spine tenderness. Positive active leg raise and tenderness with hip flexion.  Skin:    General: Skin is warm and dry.  Neurological:     Mental Status: She is alert and oriented to person, place, and time.    ED Results / Procedures / Treatments   Labs (all labs ordered are listed, but only abnormal results are displayed) Labs Reviewed - No data to display  EKG None  Radiology No results found.  Procedures Procedures   Medications Ordered in ED Medications  lidocaine (LIDODERM) 5 % 1 patch (has no administration in time range)  acetaminophen (TYLENOL) tablet 650 mg (has no administration in time range)  naproxen (NAPROSYN) tablet 250 mg (has no administration in time range)    ED Course  I have reviewed the triage vital signs and the nursing notes.  Pertinent labs & imaging results that were available during my care of the patient were reviewed by me and considered in my medical decision making (see chart for details).    MDM Rules/Calculators/A&P                           85 year old comes in with chief complaint of back pain.  On exam, there is no midline back pain and there is no abdominal tenderness or mass.  She has no red flags suggesting cord compression.  Patient is not a smoker, no previous history of cancer.  Given that there is no midline back pain, she reports initiation of the pain after she got out of her recliner -I do not think an emergent imaging is indicated.  Plan is to treat this conservatively with RICE and stretching exercises.  Strict ER return precautions discussed.  Patient has been advised to follow-up with her PCP if not improving.  Final Clinical Impression(s) / ED Diagnoses Final diagnoses:  Inflammation of left sacroiliac joint ([place])    Rx / DC Orders ED Discharge Orders          Ordered    naproxen sodium (ALEVE) 220 MG tablet  2 times daily with meals        02/16/21 0932    acetaminophen (TYLENOL) 500 MG  tablet  Every 6 hours PRN        02/16/21 0932    Lidocaine 4 % PTCH  2 times daily        02/16/21 0932             Varney Biles, MD 02/16/21 [phone]

## 2021-02-26 DIAGNOSIS — Z23 Encounter for immunization: Secondary | ICD-10-CM | POA: Diagnosis not present

## 2021-02-26 DIAGNOSIS — M79645 Pain in left finger(s): Secondary | ICD-10-CM | POA: Diagnosis not present

## 2021-02-26 DIAGNOSIS — M545 Low back pain, unspecified: Secondary | ICD-10-CM | POA: Diagnosis not present

## 2021-02-26 DIAGNOSIS — R202 Paresthesia of skin: Secondary | ICD-10-CM | POA: Diagnosis not present

## 2021-03-28 DIAGNOSIS — M810 Age-related osteoporosis without current pathological fracture: Secondary | ICD-10-CM | POA: Diagnosis not present

## 2021-03-28 DIAGNOSIS — I10 Essential (primary) hypertension: Secondary | ICD-10-CM | POA: Diagnosis not present

## 2021-04-28 DIAGNOSIS — I10 Essential (primary) hypertension: Secondary | ICD-10-CM | POA: Diagnosis not present

## 2021-04-28 DIAGNOSIS — M19042 Primary osteoarthritis, left hand: Secondary | ICD-10-CM | POA: Diagnosis not present

## 2021-05-03 DIAGNOSIS — G629 Polyneuropathy, unspecified: Secondary | ICD-10-CM | POA: Diagnosis not present

## 2021-05-03 DIAGNOSIS — Z79899 Other long term (current) drug therapy: Secondary | ICD-10-CM | POA: Diagnosis not present

## 2021-05-03 DIAGNOSIS — R209 Unspecified disturbances of skin sensation: Secondary | ICD-10-CM | POA: Diagnosis not present

## 2021-05-03 DIAGNOSIS — G5602 Carpal tunnel syndrome, left upper limb: Secondary | ICD-10-CM | POA: Diagnosis not present

## 2021-05-16 DIAGNOSIS — G5602 Carpal tunnel syndrome, left upper limb: Secondary | ICD-10-CM | POA: Diagnosis not present

## 2021-05-16 DIAGNOSIS — G5601 Carpal tunnel syndrome, right upper limb: Secondary | ICD-10-CM | POA: Diagnosis not present

## 2021-05-28 DIAGNOSIS — M19042 Primary osteoarthritis, left hand: Secondary | ICD-10-CM | POA: Diagnosis not present

## 2021-05-28 DIAGNOSIS — I10 Essential (primary) hypertension: Secondary | ICD-10-CM | POA: Diagnosis not present

## 2021-06-28 DIAGNOSIS — I10 Essential (primary) hypertension: Secondary | ICD-10-CM | POA: Diagnosis not present

## 2021-06-28 DIAGNOSIS — M19042 Primary osteoarthritis, left hand: Secondary | ICD-10-CM | POA: Diagnosis not present

## 2021-07-03 DIAGNOSIS — M19042 Primary osteoarthritis, left hand: Secondary | ICD-10-CM | POA: Diagnosis not present

## 2021-07-03 DIAGNOSIS — Z1389 Encounter for screening for other disorder: Secondary | ICD-10-CM | POA: Diagnosis not present

## 2021-07-03 DIAGNOSIS — R7303 Prediabetes: Secondary | ICD-10-CM | POA: Diagnosis not present

## 2021-07-03 DIAGNOSIS — Z0001 Encounter for general adult medical examination with abnormal findings: Secondary | ICD-10-CM | POA: Diagnosis not present

## 2021-07-03 DIAGNOSIS — M810 Age-related osteoporosis without current pathological fracture: Secondary | ICD-10-CM | POA: Diagnosis not present

## 2021-07-03 DIAGNOSIS — I10 Essential (primary) hypertension: Secondary | ICD-10-CM | POA: Diagnosis not present

## 2021-07-19 DIAGNOSIS — G5602 Carpal tunnel syndrome, left upper limb: Secondary | ICD-10-CM | POA: Diagnosis not present

## 2021-07-19 DIAGNOSIS — G5601 Carpal tunnel syndrome, right upper limb: Secondary | ICD-10-CM | POA: Diagnosis not present

## 2021-07-31 DIAGNOSIS — M19042 Primary osteoarthritis, left hand: Secondary | ICD-10-CM | POA: Diagnosis not present

## 2021-07-31 DIAGNOSIS — I10 Essential (primary) hypertension: Secondary | ICD-10-CM | POA: Diagnosis not present

## 2021-08-31 DIAGNOSIS — I10 Essential (primary) hypertension: Secondary | ICD-10-CM | POA: Diagnosis not present

## 2021-08-31 DIAGNOSIS — M19042 Primary osteoarthritis, left hand: Secondary | ICD-10-CM | POA: Diagnosis not present

## 2021-09-30 DIAGNOSIS — M19042 Primary osteoarthritis, left hand: Secondary | ICD-10-CM | POA: Diagnosis not present

## 2021-09-30 DIAGNOSIS — I10 Essential (primary) hypertension: Secondary | ICD-10-CM | POA: Diagnosis not present

## 2021-10-31 DIAGNOSIS — M79671 Pain in right foot: Secondary | ICD-10-CM | POA: Diagnosis not present

## 2021-10-31 DIAGNOSIS — I10 Essential (primary) hypertension: Secondary | ICD-10-CM | POA: Diagnosis not present

## 2021-10-31 DIAGNOSIS — M79672 Pain in left foot: Secondary | ICD-10-CM | POA: Diagnosis not present

## 2021-10-31 DIAGNOSIS — M2041 Other hammer toe(s) (acquired), right foot: Secondary | ICD-10-CM | POA: Diagnosis not present

## 2021-10-31 DIAGNOSIS — M2042 Other hammer toe(s) (acquired), left foot: Secondary | ICD-10-CM | POA: Diagnosis not present

## 2021-10-31 DIAGNOSIS — M79674 Pain in right toe(s): Secondary | ICD-10-CM | POA: Diagnosis not present

## 2021-10-31 DIAGNOSIS — M79675 Pain in left toe(s): Secondary | ICD-10-CM | POA: Diagnosis not present

## 2021-10-31 DIAGNOSIS — M19042 Primary osteoarthritis, left hand: Secondary | ICD-10-CM | POA: Diagnosis not present

## 2021-10-31 DIAGNOSIS — L110 Acquired keratosis follicularis: Secondary | ICD-10-CM | POA: Diagnosis not present

## 2021-11-30 DIAGNOSIS — E785 Hyperlipidemia, unspecified: Secondary | ICD-10-CM | POA: Diagnosis not present

## 2021-11-30 DIAGNOSIS — I10 Essential (primary) hypertension: Secondary | ICD-10-CM | POA: Diagnosis not present

## 2021-12-31 DIAGNOSIS — M549 Dorsalgia, unspecified: Secondary | ICD-10-CM | POA: Diagnosis not present

## 2021-12-31 DIAGNOSIS — E785 Hyperlipidemia, unspecified: Secondary | ICD-10-CM | POA: Diagnosis not present

## 2021-12-31 DIAGNOSIS — M810 Age-related osteoporosis without current pathological fracture: Secondary | ICD-10-CM | POA: Diagnosis not present

## 2021-12-31 DIAGNOSIS — I10 Essential (primary) hypertension: Secondary | ICD-10-CM | POA: Diagnosis not present

## 2021-12-31 DIAGNOSIS — Z23 Encounter for immunization: Secondary | ICD-10-CM | POA: Diagnosis not present

## 2022-01-14 DIAGNOSIS — G5602 Carpal tunnel syndrome, left upper limb: Secondary | ICD-10-CM | POA: Diagnosis not present

## 2022-01-14 DIAGNOSIS — M79642 Pain in left hand: Secondary | ICD-10-CM | POA: Diagnosis not present

## 2022-01-14 DIAGNOSIS — R208 Other disturbances of skin sensation: Secondary | ICD-10-CM | POA: Diagnosis not present

## 2022-01-14 DIAGNOSIS — G5601 Carpal tunnel syndrome, right upper limb: Secondary | ICD-10-CM | POA: Diagnosis not present

## 2022-01-31 DIAGNOSIS — E785 Hyperlipidemia, unspecified: Secondary | ICD-10-CM | POA: Diagnosis not present

## 2022-01-31 DIAGNOSIS — I10 Essential (primary) hypertension: Secondary | ICD-10-CM | POA: Diagnosis not present

## 2022-02-06 DIAGNOSIS — M79675 Pain in left toe(s): Secondary | ICD-10-CM | POA: Diagnosis not present

## 2022-02-06 DIAGNOSIS — M79671 Pain in right foot: Secondary | ICD-10-CM | POA: Diagnosis not present

## 2022-02-06 DIAGNOSIS — B351 Tinea unguium: Secondary | ICD-10-CM | POA: Diagnosis not present

## 2022-02-06 DIAGNOSIS — L110 Acquired keratosis follicularis: Secondary | ICD-10-CM | POA: Diagnosis not present

## 2022-02-06 DIAGNOSIS — M79672 Pain in left foot: Secondary | ICD-10-CM | POA: Diagnosis not present

## 2022-02-06 DIAGNOSIS — M79674 Pain in right toe(s): Secondary | ICD-10-CM | POA: Diagnosis not present

## 2022-02-06 DIAGNOSIS — L609 Nail disorder, unspecified: Secondary | ICD-10-CM | POA: Diagnosis not present

## 2022-02-07 DIAGNOSIS — G5602 Carpal tunnel syndrome, left upper limb: Secondary | ICD-10-CM | POA: Diagnosis not present

## 2022-03-02 DIAGNOSIS — I10 Essential (primary) hypertension: Secondary | ICD-10-CM | POA: Diagnosis not present

## 2022-03-02 DIAGNOSIS — E785 Hyperlipidemia, unspecified: Secondary | ICD-10-CM | POA: Diagnosis not present

## 2022-04-02 DIAGNOSIS — E785 Hyperlipidemia, unspecified: Secondary | ICD-10-CM | POA: Diagnosis not present

## 2022-04-02 DIAGNOSIS — I10 Essential (primary) hypertension: Secondary | ICD-10-CM | POA: Diagnosis not present

## 2022-05-02 DIAGNOSIS — E785 Hyperlipidemia, unspecified: Secondary | ICD-10-CM | POA: Diagnosis not present

## 2022-05-02 DIAGNOSIS — I10 Essential (primary) hypertension: Secondary | ICD-10-CM | POA: Diagnosis not present

## 2022-05-13 DIAGNOSIS — I739 Peripheral vascular disease, unspecified: Secondary | ICD-10-CM | POA: Diagnosis not present

## 2022-05-13 DIAGNOSIS — L110 Acquired keratosis follicularis: Secondary | ICD-10-CM | POA: Diagnosis not present

## 2022-05-13 DIAGNOSIS — M79675 Pain in left toe(s): Secondary | ICD-10-CM | POA: Diagnosis not present

## 2022-05-13 DIAGNOSIS — M79672 Pain in left foot: Secondary | ICD-10-CM | POA: Diagnosis not present

## 2022-05-13 DIAGNOSIS — M79674 Pain in right toe(s): Secondary | ICD-10-CM | POA: Diagnosis not present

## 2022-05-13 DIAGNOSIS — M79671 Pain in right foot: Secondary | ICD-10-CM | POA: Diagnosis not present

## 2022-06-02 DIAGNOSIS — I10 Essential (primary) hypertension: Secondary | ICD-10-CM | POA: Diagnosis not present

## 2022-06-02 DIAGNOSIS — E785 Hyperlipidemia, unspecified: Secondary | ICD-10-CM | POA: Diagnosis not present

## 2022-07-01 DIAGNOSIS — G5602 Carpal tunnel syndrome, left upper limb: Secondary | ICD-10-CM | POA: Diagnosis not present

## 2022-07-03 DIAGNOSIS — E785 Hyperlipidemia, unspecified: Secondary | ICD-10-CM | POA: Diagnosis not present

## 2022-07-03 DIAGNOSIS — I10 Essential (primary) hypertension: Secondary | ICD-10-CM | POA: Diagnosis not present

## 2022-08-04 DIAGNOSIS — M810 Age-related osteoporosis without current pathological fracture: Secondary | ICD-10-CM | POA: Diagnosis not present

## 2022-08-04 DIAGNOSIS — Z0001 Encounter for general adult medical examination with abnormal findings: Secondary | ICD-10-CM | POA: Diagnosis not present

## 2022-08-04 DIAGNOSIS — Z1389 Encounter for screening for other disorder: Secondary | ICD-10-CM | POA: Diagnosis not present

## 2022-08-04 DIAGNOSIS — M19042 Primary osteoarthritis, left hand: Secondary | ICD-10-CM | POA: Diagnosis not present

## 2022-08-04 DIAGNOSIS — E785 Hyperlipidemia, unspecified: Secondary | ICD-10-CM | POA: Diagnosis not present

## 2022-08-04 DIAGNOSIS — R7303 Prediabetes: Secondary | ICD-10-CM | POA: Diagnosis not present

## 2022-08-04 DIAGNOSIS — I10 Essential (primary) hypertension: Secondary | ICD-10-CM | POA: Diagnosis not present

## 2022-08-07 DIAGNOSIS — E785 Hyperlipidemia, unspecified: Secondary | ICD-10-CM | POA: Diagnosis not present

## 2022-08-07 DIAGNOSIS — I10 Essential (primary) hypertension: Secondary | ICD-10-CM | POA: Diagnosis not present

## 2022-08-07 DIAGNOSIS — R7303 Prediabetes: Secondary | ICD-10-CM | POA: Diagnosis not present

## 2022-08-07 DIAGNOSIS — Z0001 Encounter for general adult medical examination with abnormal findings: Secondary | ICD-10-CM | POA: Diagnosis not present

## 2022-09-04 DIAGNOSIS — E785 Hyperlipidemia, unspecified: Secondary | ICD-10-CM | POA: Diagnosis not present

## 2022-09-04 DIAGNOSIS — I10 Essential (primary) hypertension: Secondary | ICD-10-CM | POA: Diagnosis not present

## 2022-09-09 DIAGNOSIS — B351 Tinea unguium: Secondary | ICD-10-CM | POA: Diagnosis not present

## 2022-09-09 DIAGNOSIS — L609 Nail disorder, unspecified: Secondary | ICD-10-CM | POA: Diagnosis not present

## 2022-09-09 DIAGNOSIS — M79672 Pain in left foot: Secondary | ICD-10-CM | POA: Diagnosis not present

## 2022-09-09 DIAGNOSIS — M79671 Pain in right foot: Secondary | ICD-10-CM | POA: Diagnosis not present

## 2022-09-09 DIAGNOSIS — M79674 Pain in right toe(s): Secondary | ICD-10-CM | POA: Diagnosis not present

## 2022-09-09 DIAGNOSIS — M79675 Pain in left toe(s): Secondary | ICD-10-CM | POA: Diagnosis not present

## 2022-09-09 DIAGNOSIS — L110 Acquired keratosis follicularis: Secondary | ICD-10-CM | POA: Diagnosis not present

## 2022-09-30 DIAGNOSIS — G5602 Carpal tunnel syndrome, left upper limb: Secondary | ICD-10-CM | POA: Diagnosis not present

## 2022-10-04 DIAGNOSIS — E785 Hyperlipidemia, unspecified: Secondary | ICD-10-CM | POA: Diagnosis not present

## 2022-10-04 DIAGNOSIS — I10 Essential (primary) hypertension: Secondary | ICD-10-CM | POA: Diagnosis not present

## 2022-11-04 DIAGNOSIS — I10 Essential (primary) hypertension: Secondary | ICD-10-CM | POA: Diagnosis not present

## 2022-11-04 DIAGNOSIS — E785 Hyperlipidemia, unspecified: Secondary | ICD-10-CM | POA: Diagnosis not present

## 2022-12-04 DIAGNOSIS — I10 Essential (primary) hypertension: Secondary | ICD-10-CM | POA: Diagnosis not present

## 2022-12-04 DIAGNOSIS — E785 Hyperlipidemia, unspecified: Secondary | ICD-10-CM | POA: Diagnosis not present

## 2022-12-08 IMAGING — DX DG HAND COMPLETE 3+V*L*
3 series · 3 of 3 positions shown · non-contrast
Comparison: None.

CLINICAL DATA: Left hand pain.  No known injury.

EXAM:
LEFT HAND - COMPLETE 3+ VIEW

[hand pa]
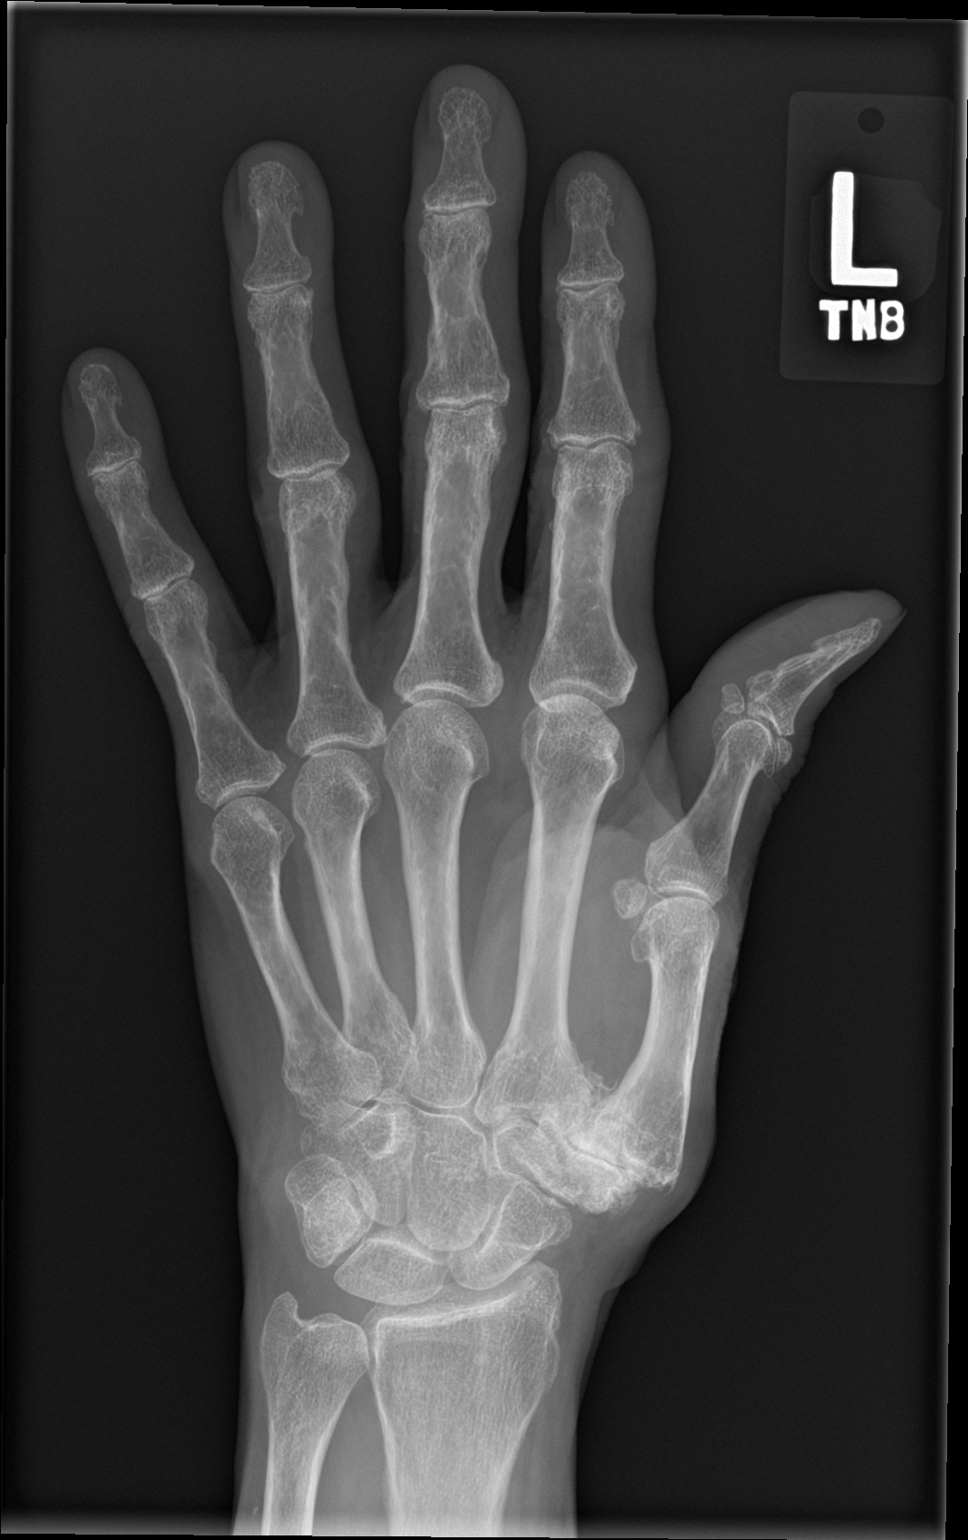

[hand obl]
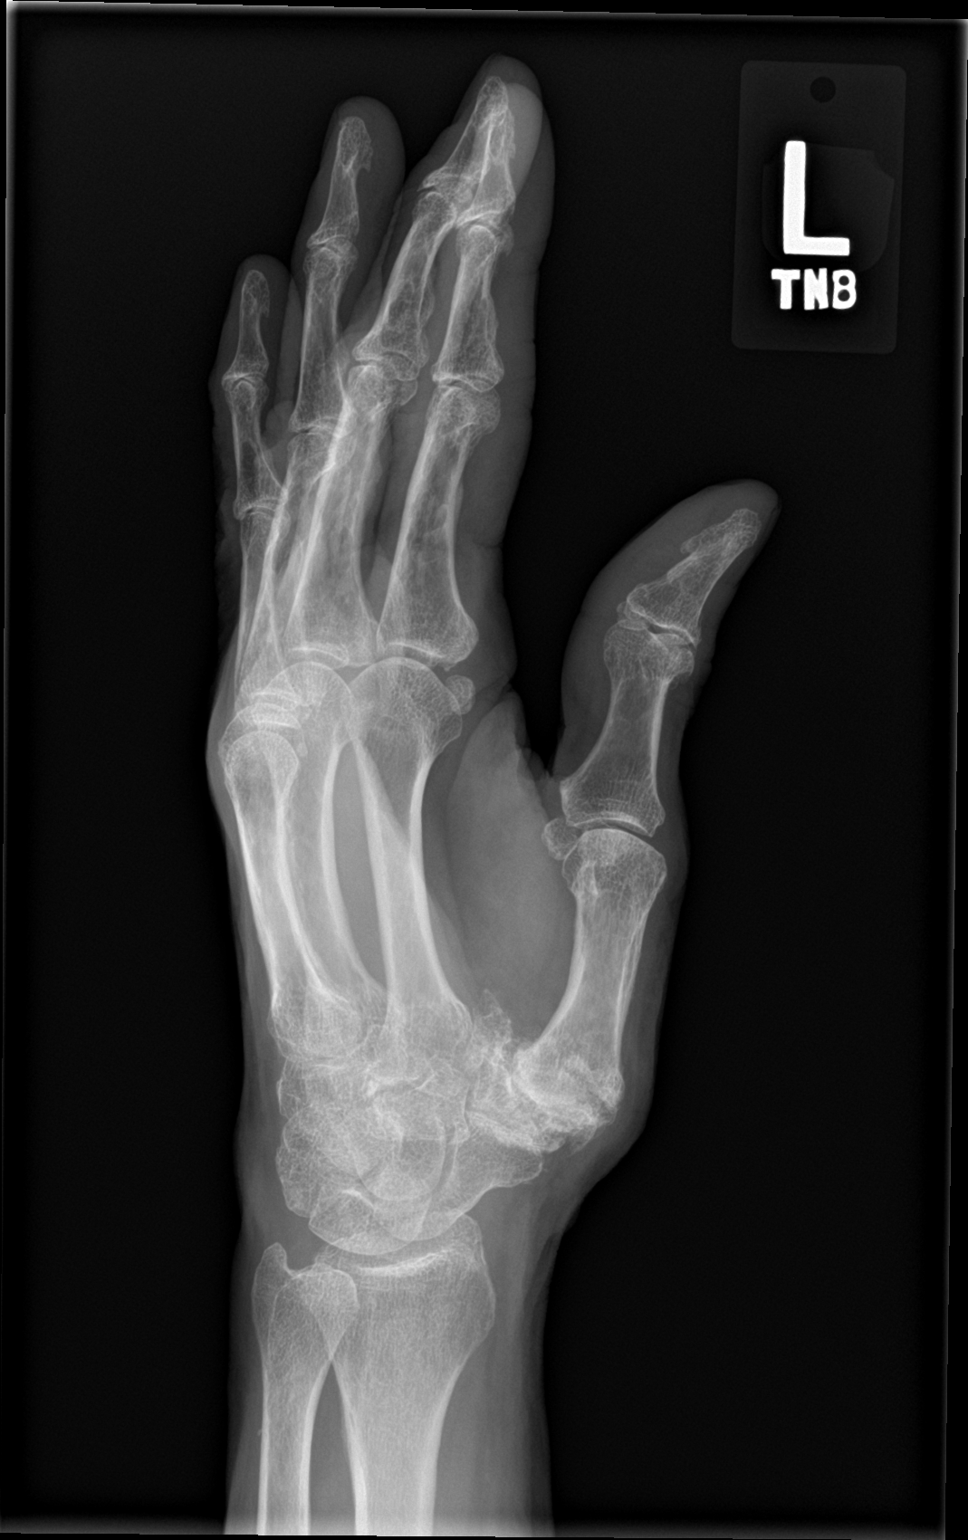

[hand lat]
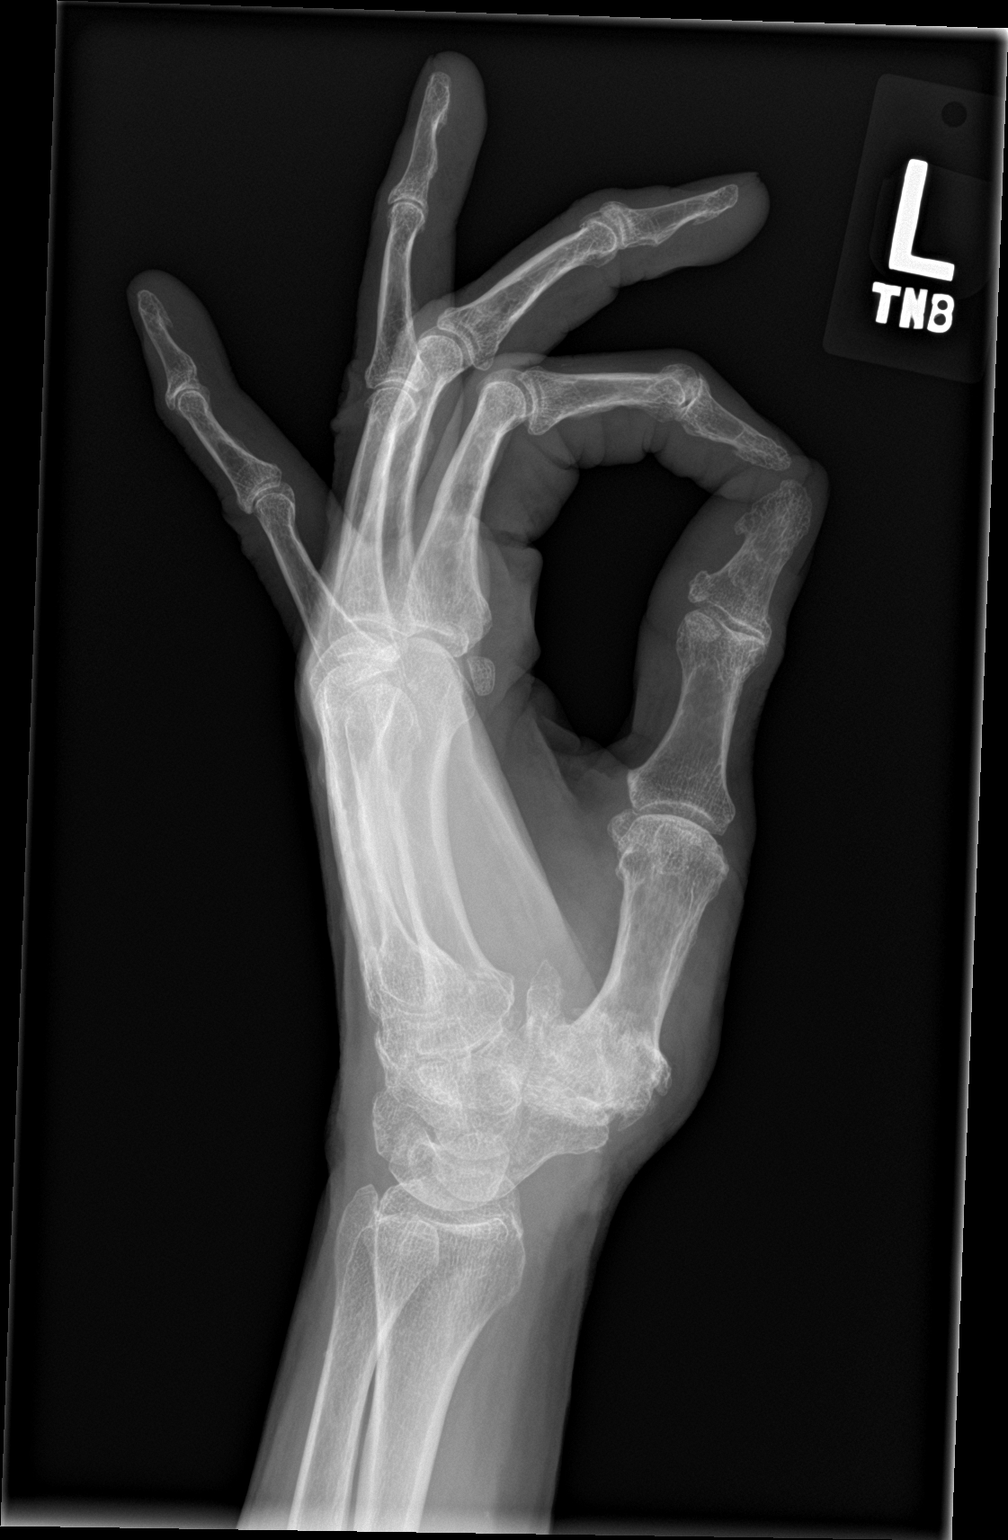

[3 of 3 positions shown; findings below may reference images not displayed]

FINDINGS: Advanced degenerative change in the base of the thumb with
subluxation, joint space narrowing, spurring. Mild osteoarthritis in
the interphalangeal joints diffusely. No erosion.

Negative for fracture.
IMPRESSION: Osteoarthritis.  Severe degenerative change base of thumb.

## 2022-12-16 DIAGNOSIS — I739 Peripheral vascular disease, unspecified: Secondary | ICD-10-CM | POA: Diagnosis not present

## 2022-12-16 DIAGNOSIS — M79672 Pain in left foot: Secondary | ICD-10-CM | POA: Diagnosis not present

## 2022-12-16 DIAGNOSIS — M79674 Pain in right toe(s): Secondary | ICD-10-CM | POA: Diagnosis not present

## 2022-12-16 DIAGNOSIS — M79671 Pain in right foot: Secondary | ICD-10-CM | POA: Diagnosis not present

## 2022-12-16 DIAGNOSIS — L110 Acquired keratosis follicularis: Secondary | ICD-10-CM | POA: Diagnosis not present

## 2022-12-16 DIAGNOSIS — M79675 Pain in left toe(s): Secondary | ICD-10-CM | POA: Diagnosis not present

## 2022-12-23 DIAGNOSIS — G5602 Carpal tunnel syndrome, left upper limb: Secondary | ICD-10-CM | POA: Diagnosis not present

## 2023-01-04 DIAGNOSIS — I10 Essential (primary) hypertension: Secondary | ICD-10-CM | POA: Diagnosis not present

## 2023-01-04 DIAGNOSIS — E785 Hyperlipidemia, unspecified: Secondary | ICD-10-CM | POA: Diagnosis not present

## 2023-01-07 DIAGNOSIS — I10 Essential (primary) hypertension: Secondary | ICD-10-CM | POA: Diagnosis not present

## 2023-01-07 DIAGNOSIS — R42 Dizziness and giddiness: Secondary | ICD-10-CM | POA: Diagnosis not present

## 2023-01-07 DIAGNOSIS — G5791 Unspecified mononeuropathy of right lower limb: Secondary | ICD-10-CM | POA: Diagnosis not present

## 2023-02-10 DIAGNOSIS — M19042 Primary osteoarthritis, left hand: Secondary | ICD-10-CM | POA: Diagnosis not present

## 2023-02-10 DIAGNOSIS — I10 Essential (primary) hypertension: Secondary | ICD-10-CM | POA: Diagnosis not present

## 2023-02-10 DIAGNOSIS — E785 Hyperlipidemia, unspecified: Secondary | ICD-10-CM | POA: Diagnosis not present

## 2023-02-10 DIAGNOSIS — M810 Age-related osteoporosis without current pathological fracture: Secondary | ICD-10-CM | POA: Diagnosis not present

## 2023-02-10 DIAGNOSIS — Z23 Encounter for immunization: Secondary | ICD-10-CM | POA: Diagnosis not present

## 2023-03-12 DIAGNOSIS — I10 Essential (primary) hypertension: Secondary | ICD-10-CM | POA: Diagnosis not present

## 2023-03-12 DIAGNOSIS — E785 Hyperlipidemia, unspecified: Secondary | ICD-10-CM | POA: Diagnosis not present

## 2023-03-23 DIAGNOSIS — M79674 Pain in right toe(s): Secondary | ICD-10-CM | POA: Diagnosis not present

## 2023-03-23 DIAGNOSIS — M79675 Pain in left toe(s): Secondary | ICD-10-CM | POA: Diagnosis not present

## 2023-03-23 DIAGNOSIS — M79672 Pain in left foot: Secondary | ICD-10-CM | POA: Diagnosis not present

## 2023-03-23 DIAGNOSIS — M79671 Pain in right foot: Secondary | ICD-10-CM | POA: Diagnosis not present

## 2023-03-23 DIAGNOSIS — I739 Peripheral vascular disease, unspecified: Secondary | ICD-10-CM | POA: Diagnosis not present

## 2023-03-23 DIAGNOSIS — L110 Acquired keratosis follicularis: Secondary | ICD-10-CM | POA: Diagnosis not present

## 2023-03-24 DIAGNOSIS — G5602 Carpal tunnel syndrome, left upper limb: Secondary | ICD-10-CM | POA: Insufficient documentation

## 2023-04-12 DIAGNOSIS — I10 Essential (primary) hypertension: Secondary | ICD-10-CM | POA: Diagnosis not present

## 2023-04-12 DIAGNOSIS — E785 Hyperlipidemia, unspecified: Secondary | ICD-10-CM | POA: Diagnosis not present

## 2023-05-12 DIAGNOSIS — I10 Essential (primary) hypertension: Secondary | ICD-10-CM | POA: Diagnosis not present

## 2023-05-12 DIAGNOSIS — E785 Hyperlipidemia, unspecified: Secondary | ICD-10-CM | POA: Diagnosis not present

## 2023-06-12 DIAGNOSIS — I10 Essential (primary) hypertension: Secondary | ICD-10-CM | POA: Diagnosis not present

## 2023-06-12 DIAGNOSIS — E785 Hyperlipidemia, unspecified: Secondary | ICD-10-CM | POA: Diagnosis not present

## 2023-06-23 DIAGNOSIS — G5602 Carpal tunnel syndrome, left upper limb: Secondary | ICD-10-CM | POA: Diagnosis not present

## 2023-06-23 DIAGNOSIS — M1812 Unilateral primary osteoarthritis of first carpometacarpal joint, left hand: Secondary | ICD-10-CM | POA: Insufficient documentation

## 2023-06-23 DIAGNOSIS — R202 Paresthesia of skin: Secondary | ICD-10-CM | POA: Insufficient documentation

## 2023-06-26 DIAGNOSIS — R7303 Prediabetes: Secondary | ICD-10-CM | POA: Diagnosis not present

## 2023-06-26 DIAGNOSIS — I10 Essential (primary) hypertension: Secondary | ICD-10-CM | POA: Diagnosis not present

## 2023-06-26 DIAGNOSIS — G5791 Unspecified mononeuropathy of right lower limb: Secondary | ICD-10-CM | POA: Diagnosis not present

## 2023-06-26 DIAGNOSIS — E785 Hyperlipidemia, unspecified: Secondary | ICD-10-CM | POA: Diagnosis not present

## 2023-07-09 ENCOUNTER — Encounter (HOSPITAL_COMMUNITY): Payer: Self-pay

## 2023-07-09 ENCOUNTER — Emergency Department (HOSPITAL_COMMUNITY): Payer: 59

## 2023-07-09 ENCOUNTER — Emergency Department (HOSPITAL_COMMUNITY)
Admission: EM | Admit: 2023-07-09 | Discharge: 2023-07-09 | Disposition: A | Discharge status: Home / Self Care | Payer: 59 | Attending: Emergency Medicine | Admitting: Emergency Medicine

## 2023-07-09 ENCOUNTER — Other Ambulatory Visit: Payer: Self-pay

## 2023-07-09 DIAGNOSIS — Z79899 Other long term (current) drug therapy: Secondary | ICD-10-CM | POA: Insufficient documentation

## 2023-07-09 DIAGNOSIS — J101 Influenza due to other identified influenza virus with other respiratory manifestations: Secondary | ICD-10-CM | POA: Insufficient documentation

## 2023-07-09 DIAGNOSIS — Z7982 Long term (current) use of aspirin: Secondary | ICD-10-CM | POA: Insufficient documentation

## 2023-07-09 DIAGNOSIS — R059 Cough, unspecified: Secondary | ICD-10-CM | POA: Diagnosis not present

## 2023-07-09 DIAGNOSIS — Z20822 Contact with and (suspected) exposure to covid-19: Secondary | ICD-10-CM | POA: Diagnosis not present

## 2023-07-09 DIAGNOSIS — I10 Essential (primary) hypertension: Secondary | ICD-10-CM | POA: Diagnosis not present

## 2023-07-09 DIAGNOSIS — E871 Hypo-osmolality and hyponatremia: Secondary | ICD-10-CM | POA: Diagnosis not present

## 2023-07-09 DIAGNOSIS — J111 Influenza due to unidentified influenza virus with other respiratory manifestations: Secondary | ICD-10-CM

## 2023-07-09 LAB — BASIC METABOLIC PANEL
Anion gap: 11 (ref 5–15)
BUN: 14 mg/dL (ref 8–23)
CO2: 26 mmol/L (ref 22–32)
Calcium: 9.2 mg/dL (ref 8.9–10.3)
Chloride: 93 mmol/L — ABNORMAL LOW (ref 98–111)
Creatinine, Ser: 0.75 mg/dL (ref 0.44–1.00)
GFR, Estimated: >60 mL/min (ref >60)
Glucose, Bld: 129 mg/dL — ABNORMAL HIGH (ref 70–99)
Potassium: 3.5 mmol/L (ref 3.5–5.1)
Sodium: 130 mmol/L — ABNORMAL LOW (ref 135–145)

## 2023-07-09 LAB — CBC WITH DIFFERENTIAL/PLATELET
Abs Immature Granulocytes: 0.02 10*3/uL (ref 0.00–0.07)
Basophils Absolute: 0 10*3/uL (ref 0.0–0.1)
Basophils Relative: 0 %
Eosinophils Absolute: 0 10*3/uL (ref 0.0–0.5)
Eosinophils Relative: 0 %
HCT: 38.9 % (ref 36.0–46.0)
Hemoglobin: 13 g/dL (ref 12.0–15.0)
Immature Granulocytes: 0 %
Lymphocytes Relative: 6 %
Lymphs Abs: 0.4 10*3/uL — ABNORMAL LOW (ref 0.7–4.0)
MCH: 28.9 pg (ref 26.0–34.0)
MCHC: 33.4 g/dL (ref 30.0–36.0)
MCV: 86.4 fL (ref 80.0–100.0)
Monocytes Absolute: 0.6 10*3/uL (ref 0.1–1.0)
Monocytes Relative: 9 %
Neutro Abs: 5.7 10*3/uL (ref 1.7–7.7)
Neutrophils Relative %: 85 %
Platelets: 233 10*3/uL (ref 150–400)
RBC: 4.5 MIL/uL (ref 3.87–5.11)
RDW: 13.2 % (ref 11.5–15.5)
WBC: 6.8 10*3/uL (ref 4.0–10.5)
nRBC: 0 % (ref 0.0–0.2)

## 2023-07-09 LAB — RESP PANEL BY RT-PCR (RSV, FLU A&B, COVID)  RVPGX2
Influenza A by PCR: POSITIVE — AB
Influenza B by PCR: NEGATIVE
Resp Syncytial Virus by PCR: NEGATIVE
SARS Coronavirus 2 by RT PCR: NEGATIVE

## 2023-07-09 MED ORDER — BENZONATATE 100 MG PO CAPS: 100.0000 mg | ORAL_CAPSULE | Freq: Three times a day (TID) | ORAL | 0 refills | Status: AC

## 2023-07-09 MED ORDER — BENZONATATE 100 MG PO CAPS
200.0000 mg | ORAL_CAPSULE | Freq: Once | ORAL | Status: AC
  Administered 2023-07-09: 200 mg via ORAL
  Filled 2023-07-09: qty 2

## 2023-07-09 MED ORDER — ACETAMINOPHEN 325 MG PO TABS
650.0000 mg | ORAL_TABLET | Freq: Once | ORAL | Status: AC
  Administered 2023-07-09: 650 mg via ORAL
  Filled 2023-07-09: qty 2

## 2023-07-09 NOTE — ED Provider Notes (Signed)
 [place] EMERGENCY DEPARTMENT AT [facility] Provider Note   CSN: [id number] Arrival date & time: 07/09/23  1118     History  Chief Complaint  Patient presents with   Cough    Tanya Daniel is a 88 y.o. female.  She has PMH of hypertension, high cholesterol.  Presents to ER for persistent cough x 1 week, states last night the cough kept her awake all night.  It is nonproductive, she denies fever or chills, denies chest pain or shortness of breath, denies abdominal pain, nausea vomiting or diarrhea, no urinary symptoms.   Cough      Home Medications Prior to Admission medications   Medication Sig Start Date End Date Taking? Authorizing Provider  benzonatate  (TESSALON ) 100 MG capsule Take 1 capsule (100 mg total) by mouth every 8 (eight) hours. 07/09/23  Yes Kamoni Gentles A, PA-C  acetaminophen  (TYLENOL ) 500 MG tablet Take 1 tablet (500 mg total) by mouth every 6 (six) hours as needed. 02/16/21   Nanavati, Ankit, MD  alendronate (FOSAMAX) 70 MG tablet Take 70 mg by mouth once a week. Take with a full glass of water  on an empty stomach.    [provider]  amLODipine (NORVASC) 5 MG tablet Take 1 tablet by mouth daily. 09/25/17   [provider]  Ascorbic Acid (VITAMIN C PO) Take by mouth.    [provider]  aspirin EC 81 MG tablet Take 81 mg by mouth daily.    [provider]  Calcium Carb-Cholecalciferol (CALCIUM + D3 PO) Take by mouth.    [provider]  HYDROcodone -acetaminophen  (NORCO/VICODIN) 5-325 MG tablet Take 0.5 tablets by mouth every 6 (six) hours as needed. 10/19/17   Murray, Alyssa B, PA-C  Lidocaine  4 % PTCH Apply 1 patch topically 2 (two) times daily. 02/16/21   Charlyn Sora, MD  lisinopril-hydrochlorothiazide (PRINZIDE,ZESTORETIC) 10-12.5 MG per tablet Take 1 tablet by mouth daily.    [provider]  naproxen  sodium (ALEVE ) 220 MG tablet Take 1 tablet (220 mg total) by mouth 2 (two) times daily with a  meal. 02/16/21   Charlyn Sora, MD  Omega-3 Fatty Acids (FISH OIL) 1000 MG CAPS Take 1 capsule by mouth 3 (three) times daily.     [provider]  simvastatin (ZOCOR) 40 MG tablet Take 40 mg by mouth daily.    [provider]  traMADol (ULTRAM) 50 MG tablet Take 50 mg by mouth every 6 (six) hours as needed for moderate pain.     [provider]      Allergies    Patient has no known allergies.    Review of Systems   Review of Systems  Respiratory:  Positive for cough.     Physical Exam Updated Vital Signs BP (!) 140/73 (BP Location: Right Arm)   Pulse 100   Temp 99 F (37.2 C) (Oral)   Resp 19   Ht 5' 1 (1.549 m)   Wt 59 kg   SpO2 97%   BMI 24.58 kg/m  Physical Exam Vitals and nursing note reviewed.  Constitutional:      General: She is not in acute distress.    Appearance: She is well-developed.  HENT:     Head: Normocephalic and atraumatic.     Mouth/Throat:     Mouth: Mucous membranes are moist.  Eyes:     Conjunctiva/sclera: Conjunctivae normal.  Cardiovascular:     Rate and Rhythm: Normal rate and regular rhythm.  Heart sounds: No murmur heard. Pulmonary:     Effort: Pulmonary effort is normal. No respiratory distress.     Breath sounds: Normal breath sounds. No wheezing, rhonchi or rales.  Abdominal:     Palpations: Abdomen is soft.     Tenderness: There is no abdominal tenderness.  Musculoskeletal:        General: No swelling.     Cervical back: Neck supple.  Skin:    General: Skin is warm and dry.     Capillary Refill: Capillary refill takes less than 2 seconds.  Neurological:     General: No focal deficit present.     Mental Status: She is alert and oriented to person, place, and time.  Psychiatric:        Mood and Affect: Mood normal.     ED Results / Procedures / Treatments   Labs (all labs ordered are listed, but only abnormal results are displayed) Labs Reviewed  RESP PANEL BY RT-PCR (RSV, FLU A&B, COVID)   RVPGX2 - Abnormal; Notable for the following components:      Result Value   Influenza A by PCR POSITIVE (*)    All other components within normal limits  CBC WITH DIFFERENTIAL/PLATELET - Abnormal; Notable for the following components:   Lymphs Abs 0.4 (*)    All other components within normal limits  BASIC METABOLIC PANEL - Abnormal; Notable for the following components:   Sodium 130 (*)    Chloride 93 (*)    Glucose, Bld 129 (*)    All other components within normal limits    EKG None  Radiology DG Chest 2 View Result Date: 07/09/2023 CLINICAL DATA:  Cough for 1 week. EXAM: CHEST - 2 VIEW COMPARISON:  None Available. FINDINGS: The heart size and mediastinal contours are within normal limits. Both lungs are clear. Degenerative changes are seen involving both shoulder joints as well as the thoracic spine. IMPRESSION: No active cardiopulmonary disease. Electronically Signed   By: Lynwood Landy Raddle M.D.   On: 07/09/2023 13:38    Procedures Procedures    Medications Ordered in ED Medications  benzonatate  (TESSALON ) capsule 200 mg (200 mg Oral Given 07/09/23 1444)  acetaminophen  (TYLENOL ) tablet 650 mg (650 mg Oral Given 07/09/23 1444)    ED Course/ Medical Decision Making/ A&P                                 Medical Decision Making This patient presents to the ED for concern of cough x 5 days, this involves an extensive number of treatment options, and is a complaint that carries with it a high risk of complications and morbidity.  The differential diagnosis includes influenza, COVID-19, pneumonia, viral syndrome, postnasal drip, GERD, other   Co morbidities that complicate the patient evaluation  High blood pressure, high cholesterol   Additional history obtained:  Additional history obtained from EMR, External records from outside source obtained and reviewed including labs, prior notes   Lab Tests:  I Ordered, and personally interpreted labs.  The pertinent results  include: Patient positive for influenza A, CBC shows no leukocytosis, no anemia, BMP shows mild hyponatremia with sodium of 130 hypochloremia 93, renal function is normal   Imaging Studies ordered:  I ordered imaging studies including chest x-ray I independently visualized and interpreted imaging which showed no pulmonary edema or infiltrate I agree with the radiologist interpretation     Problem List / ED  Course / Critical interventions / Medication management  Cough-patient has influenza A, family at that side has similar symptoms.  Patient is on day 5 of illness, out of window for Tamiflu, well-appearing will hydrated, initially mildly tachycardic but on recheck heart rate is 96 at bedside.  Cough improved with benzonatate , advised she could also try tea, drink plenty of fluids, given her sodium being slightly low advise drinking electrolyte drink such as Gatorade and salting her food and follow-up close with PCP for recheck of her sodium, given strict return precautions.  At this time I do not think there is any need for antibiotics. I ordered medication including benzonatate  for cough Reevaluation of the patient after these medicines showed that the patient improved I have reviewed the patients home medicines and have made adjustments as needed      Amount and/or Complexity of Data Reviewed Labs: ordered. Radiology: ordered.  Risk OTC drugs. Prescription drug management.           Final Clinical Impression(s) / ED Diagnoses Final diagnoses:  Influenza  Hyponatremia    Rx / DC Orders ED Discharge Orders          Ordered    benzonatate  (TESSALON ) 100 MG capsule  Every 8 hours        07/09/23 1552              Suellen Sherran DELENA DEVONNA 07/09/23 1556    Patsey Lot, MD 07/10/23 1452

## 2023-07-09 NOTE — ED Triage Notes (Signed)
 Pt arrived via POV from home c/o persistent cough for over 1 week. Pt denies any respiratory history. Pt denies fever, HA, denies chills.

## 2023-07-09 NOTE — Discharge Instructions (Addendum)
 It was a pleasure taking care of you today.  You were seen for cough.  You are having congestion as well.  You have influenza A.  Your chest x-ray did not show pneumonia or any other abnormalities.  We are prescribing benzonatate  for cough.  Make sure you drink enough fluids and rest.  Your blood work also showed that your sodium was somewhat low.  This could be due to several reasons including your blood pressure medication.  When you are drinking fluids can include electrolyte drinks such as Gatorade, and you can add salt to your food for the next several days and have your primary care doctor recheck your blood work in several days.  Call them tomorrow to schedule follow-up appointment to have these labs drawn.  Come back to ER if you have shortness of breath, fever, vomiting or any other new or worsening symptoms.

## 2023-07-27 DIAGNOSIS — I10 Essential (primary) hypertension: Secondary | ICD-10-CM | POA: Diagnosis not present

## 2023-07-27 DIAGNOSIS — E785 Hyperlipidemia, unspecified: Secondary | ICD-10-CM | POA: Diagnosis not present

## 2023-08-12 DIAGNOSIS — R7303 Prediabetes: Secondary | ICD-10-CM | POA: Diagnosis not present

## 2023-08-12 DIAGNOSIS — Z0001 Encounter for general adult medical examination with abnormal findings: Secondary | ICD-10-CM | POA: Diagnosis not present

## 2023-08-12 DIAGNOSIS — M19042 Primary osteoarthritis, left hand: Secondary | ICD-10-CM | POA: Diagnosis not present

## 2023-08-12 DIAGNOSIS — H40053 Ocular hypertension, bilateral: Secondary | ICD-10-CM | POA: Diagnosis not present

## 2023-08-12 DIAGNOSIS — M810 Age-related osteoporosis without current pathological fracture: Secondary | ICD-10-CM | POA: Diagnosis not present

## 2023-08-12 DIAGNOSIS — H40032 Anatomical narrow angle, left eye: Secondary | ICD-10-CM | POA: Diagnosis not present

## 2023-08-12 DIAGNOSIS — H2513 Age-related nuclear cataract, bilateral: Secondary | ICD-10-CM | POA: Diagnosis not present

## 2023-08-12 DIAGNOSIS — H40003 Preglaucoma, unspecified, bilateral: Secondary | ICD-10-CM | POA: Diagnosis not present

## 2023-08-12 DIAGNOSIS — Z1389 Encounter for screening for other disorder: Secondary | ICD-10-CM | POA: Diagnosis not present

## 2023-08-12 DIAGNOSIS — H5203 Hypermetropia, bilateral: Secondary | ICD-10-CM | POA: Diagnosis not present

## 2023-08-12 DIAGNOSIS — G5602 Carpal tunnel syndrome, left upper limb: Secondary | ICD-10-CM | POA: Diagnosis not present

## 2023-08-12 DIAGNOSIS — E785 Hyperlipidemia, unspecified: Secondary | ICD-10-CM | POA: Diagnosis not present

## 2023-08-12 DIAGNOSIS — I10 Essential (primary) hypertension: Secondary | ICD-10-CM | POA: Diagnosis not present

## 2023-08-18 DIAGNOSIS — H40032 Anatomical narrow angle, left eye: Secondary | ICD-10-CM | POA: Diagnosis not present

## 2023-08-18 DIAGNOSIS — H2513 Age-related nuclear cataract, bilateral: Secondary | ICD-10-CM | POA: Diagnosis not present

## 2023-08-18 DIAGNOSIS — H40053 Ocular hypertension, bilateral: Secondary | ICD-10-CM | POA: Diagnosis not present

## 2023-08-18 DIAGNOSIS — H40003 Preglaucoma, unspecified, bilateral: Secondary | ICD-10-CM | POA: Diagnosis not present

## 2023-09-12 DIAGNOSIS — I10 Essential (primary) hypertension: Secondary | ICD-10-CM | POA: Diagnosis not present

## 2023-09-12 DIAGNOSIS — E785 Hyperlipidemia, unspecified: Secondary | ICD-10-CM | POA: Diagnosis not present

## 2023-09-15 DIAGNOSIS — H2512 Age-related nuclear cataract, left eye: Secondary | ICD-10-CM | POA: Diagnosis not present

## 2023-09-22 NOTE — H&P (Signed)
 Surgical History & Physical  Patient Name: Tanya Daniel  DOB: [birth year]  Surgery: Cataract extraction with intraocular lens implant phacoemulsification; Left Eye Surgeon: Ardeth Krabbe MD Surgery Date: 09/25/2023 Pre-Op Date: 08/18/2023  HPI: A 60 Yr. old female patient 1. The patient is here for cataract evaluation. Pt. complains of sunlight glare causing poor vision. Both eyes are affected. The episode is constant. Symptoms occur when the patient is reading. The complaint is associated with blurry vision. This is negatively affecting the patient's quality of life and the patient is unable to function adequately in life with the current level of vision. HPI was performed by Ardeth Krabbe .  Medical History: Dry Eyes Cataracts  Arthritis High Blood Pressure LDL  Review of Systems Cardiovascular High Blood Pressure Musculoskeletal arthritis pains All recorded systems are negative except as noted above.  Social Never smoked    Medication Alendronate, Promethazine-DM, Lisinopril-hydrochlorothiazide, Tramadol, Amlodipine, Simvastatin, Benzonatate , Gabapentin, Latanoprost  Sx/Procedures Hysterectomy  Drug Allergies  NKDA  History & Physical: Heent: cataract NECK: supple without bruits LUNGS: lungs clear to auscultation CV: regular rate and rhythm Abdomen: soft and non-tender  Impression & Plan: Assessment: 1.  GLAUCOMA SUSPECT; Both Eyes (H40.003) 2.  Ocular Hypertension; Both Eyes (H40.053) 3.  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  CATARACT NUCLEAR SCLEROSIS AGE RELATED; Both Eyes (H25.13) 5.  Hyperopia; Both Eyes (H52.03) 6.  ARCUS SENILIS; Both Eyes (H18.413)  Plan: 1.  Glaucoma: somewhat narrow angles in each eye and elevated pressures. Asymmetric cupping. Will plan to return for cataract measurements and then proceed with cataract surgery.  - IOP improved Latanoprost QHS OU. Will proceed with cataract surgery to open angle  Current gtts:  Family  Hx: denies  Tmax: 24/23  Gonio (08/12/23): shallow OU but able to see SS OD and able to see PTM OS in 180 degrees CCT: 523/527  OCT (08/12/23): OD: ave 85, full throughout OS: ave 59, thin  VF 24-2 ( / / ) OD: OS:  2.  as above  3.  as above  4.  Cataracts are visually significant and account for the patient's complaints. Discussed all risks, benefits, procedures and recovery, including infection, loss of vision and eye, need for glasses after surgery or additional procedures. Patient understands changing glasses will not improve vision. Patient indicated understanding of procedure. All questions answered. Patient desires to have surgery, recommend phacoemulsification with intraocular lens. Patient to have preliminary testing necessary (Argos/IOL Master, Mac OCT, TOPO) Educational materials provided:Cataract.  Plan: - Proceed with cataract surgery OS followed by OD - Plan for best distance target with DIB00 - No DM, no fuchs, no prior eye surgery - narrow angle, has not been dilated yet - dextenza   5.  continue with current for now  6. observation

## 2023-09-23 ENCOUNTER — Encounter (HOSPITAL_COMMUNITY)
Admission: RE | Admit: 2023-09-23 | Discharge: 2023-09-23 | Disposition: A | Discharge status: Home / Self Care | Source: Ambulatory Visit | Attending: Optometry | Admitting: Optometry

## 2023-09-23 ENCOUNTER — Encounter (HOSPITAL_COMMUNITY): Payer: Self-pay

## 2023-09-23 ENCOUNTER — Other Ambulatory Visit: Payer: Self-pay

## 2023-09-23 NOTE — Patient Instructions (Signed)
 Tanya Daniel

## 2023-09-25 ENCOUNTER — Encounter (HOSPITAL_COMMUNITY)
Admission: RE | Disposition: A | Discharge status: Home / Self Care | Payer: Self-pay | Source: Home / Self Care | Attending: Optometry

## 2023-09-25 ENCOUNTER — Ambulatory Visit (HOSPITAL_COMMUNITY)
Admission: RE | Admit: 2023-09-25 | Discharge: 2023-09-25 | Disposition: A | Discharge status: Home / Self Care | Attending: Optometry | Admitting: Optometry

## 2023-09-25 ENCOUNTER — Ambulatory Visit (HOSPITAL_COMMUNITY): Admitting: Certified Registered"

## 2023-09-25 ENCOUNTER — Encounter (HOSPITAL_COMMUNITY): Payer: Self-pay | Admitting: Optometry

## 2023-09-25 DIAGNOSIS — H40003 Preglaucoma, unspecified, bilateral: Secondary | ICD-10-CM | POA: Insufficient documentation

## 2023-09-25 DIAGNOSIS — H2513 Age-related nuclear cataract, bilateral: Secondary | ICD-10-CM | POA: Diagnosis not present

## 2023-09-25 DIAGNOSIS — I10 Essential (primary) hypertension: Secondary | ICD-10-CM | POA: Insufficient documentation

## 2023-09-25 DIAGNOSIS — H18413 Arcus senilis, bilateral: Secondary | ICD-10-CM | POA: Insufficient documentation

## 2023-09-25 DIAGNOSIS — H5203 Hypermetropia, bilateral: Secondary | ICD-10-CM | POA: Diagnosis not present

## 2023-09-25 DIAGNOSIS — H40053 Ocular hypertension, bilateral: Secondary | ICD-10-CM | POA: Insufficient documentation

## 2023-09-25 DIAGNOSIS — H5712 Ocular pain, left eye: Secondary | ICD-10-CM | POA: Diagnosis not present

## 2023-09-25 DIAGNOSIS — H40032 Anatomical narrow angle, left eye: Secondary | ICD-10-CM | POA: Insufficient documentation

## 2023-09-25 DIAGNOSIS — H2512 Age-related nuclear cataract, left eye: Secondary | ICD-10-CM | POA: Diagnosis not present

## 2023-09-25 HISTORY — PX: INSERTION, STENT, DRUG-ELUTING, LACRIMAL CANALICULUS: SHX7453

## 2023-09-25 HISTORY — PX: CATARACT EXTRACTION W/PHACO: SHX586

## 2023-09-25 SURGERY — PHACOEMULSIFICATION, CATARACT, WITH IOL INSERTION
Anesthesia: Monitor Anesthesia Care | Site: Eye | Laterality: Left

## 2023-09-25 MED ORDER — PHENYLEPHRINE-KETOROLAC 1-0.3 % IO SOLN
INTRAOCULAR | Status: AC
  Filled 2023-09-25: qty 4

## 2023-09-25 MED ORDER — TETRACAINE HCL 0.5 % OP SOLN
1.0000 [drp] | OPHTHALMIC | Status: AC | PRN
  Administered 2023-09-25 (×3): 1 [drp] via OPHTHALMIC

## 2023-09-25 MED ORDER — POVIDONE-IODINE 5 % OP SOLN
OPHTHALMIC | Status: DC | PRN
  Administered 2023-09-25: 1 via OPHTHALMIC

## 2023-09-25 MED ORDER — STERILE WATER FOR IRRIGATION IR SOLN
Status: DC | PRN
  Administered 2023-09-25: 250 mL

## 2023-09-25 MED ORDER — LIDOCAINE HCL (PF) 1 % IJ SOLN
INTRAMUSCULAR | Status: DC | PRN
  Administered 2023-09-25: 2 mL

## 2023-09-25 MED ORDER — MOXIFLOXACIN HCL 5 MG/ML IO SOLN
INTRAOCULAR | Status: AC
  Filled 2023-09-25: qty 1

## 2023-09-25 MED ORDER — DEXAMETHASONE 0.4 MG OP INST
VAGINAL_INSERT | OPHTHALMIC | Status: AC
  Filled 2023-09-25: qty 1

## 2023-09-25 MED ORDER — PHENYLEPHRINE-KETOROLAC 1-0.3 % IO SOLN
INTRAOCULAR | Status: DC | PRN
  Administered 2023-09-25: 500 mL via OPHTHALMIC

## 2023-09-25 MED ORDER — PHENYLEPHRINE HCL 2.5 % OP SOLN
1.0000 [drp] | OPHTHALMIC | Status: AC | PRN
  Administered 2023-09-25 (×3): 1 [drp] via OPHTHALMIC

## 2023-09-25 MED ORDER — TROPICAMIDE 1 % OP SOLN
1.0000 [drp] | OPHTHALMIC | Status: AC | PRN
  Administered 2023-09-25 (×3): 1 [drp] via OPHTHALMIC

## 2023-09-25 MED ORDER — BSS IO SOLN
INTRAOCULAR | Status: DC | PRN
  Administered 2023-09-25: 15 mL via INTRAOCULAR

## 2023-09-25 MED ORDER — SIGHTPATH DOSE#1 NA HYALUR & NA CHOND-NA HYALUR IO KIT
PACK | INTRAOCULAR | Status: DC | PRN
  Administered 2023-09-25: 1 via OPHTHALMIC

## 2023-09-25 MED ORDER — LIDOCAINE HCL 3.5 % OP GEL
1.0000 | Freq: Once | OPHTHALMIC | Status: AC
  Administered 2023-09-25: 1 via OPHTHALMIC

## 2023-09-25 MED ORDER — MOXIFLOXACIN HCL 5 MG/ML IO SOLN
INTRAOCULAR | Status: DC | PRN
  Administered 2023-09-25: .2 mL via OPHTHALMIC

## 2023-09-25 MED ORDER — DEXAMETHASONE 0.4 MG OP INST
VAGINAL_INSERT | OPHTHALMIC | Status: DC | PRN
  Administered 2023-09-25: .4 mg via OPHTHALMIC

## 2023-09-25 MED ORDER — TETRACAINE 0.5 % OP SOLN OPTIME - NO CHARGE
OPHTHALMIC | Status: DC | PRN
  Administered 2023-09-25: 1 [drp] via OPHTHALMIC

## 2023-09-25 SURGICAL SUPPLY — 14 items
CATARACT SUITE SIGHTPATH (MISCELLANEOUS) ×1 IMPLANT
CLOTH BEACON ORANGE TIMEOUT ST (SAFETY) ×1 IMPLANT
DRSG TEGADERM 4X4.75 (GAUZE/BANDAGES/DRESSINGS) ×1 IMPLANT
EYE SHIELD UNIVERSAL CLEAR (GAUZE/BANDAGES/DRESSINGS) IMPLANT
FEE CATARACT SUITE SIGHTPATH (MISCELLANEOUS) ×1 IMPLANT
GLOVE BIOGEL PI IND STRL 7.0 (GLOVE) ×2 IMPLANT
LENS IOL TECNIS EYHANCE 24.0 (Intraocular Lens) IMPLANT
NDL HYPO 18GX1.5 BLUNT FILL (NEEDLE) ×1 IMPLANT
NEEDLE HYPO 18GX1.5 BLUNT FILL (NEEDLE) ×1 IMPLANT
PAD ARMBOARD POSITIONER FOAM (MISCELLANEOUS) ×1 IMPLANT
POSITIONER HEAD 8X9X4 ADT (SOFTGOODS) ×1 IMPLANT
SYR TB 1ML LL NO SAFETY (SYRINGE) ×1 IMPLANT
TAPE SURG TRANSPORE 1 IN (GAUZE/BANDAGES/DRESSINGS) IMPLANT
WATER STERILE IRR 250ML POUR (IV SOLUTION) ×1 IMPLANT

## 2023-09-25 NOTE — Op Note (Signed)
 Date of procedure: 09/25/23  Pre-operative diagnosis: Visually significant age-related nuclear cataract, Left Eye (H25.12)  Post-operative diagnosis: Visually significant age-related nuclear cataract, Left Eye  Procedure: Removal of cataract via phacoemulsification and insertion of intra-ocular lens J&J DIB00 +24.0D into the capsular bag of the Left Eye  Attending surgeon: Gale Jude, MD  Anesthesia: MAC, Topical Akten  Complications: None  Estimated Blood Loss: <74mL (minimal)  Specimens: None  Implants:  Implant Name Type Inv. Item Serial No. Manufacturer Lot No. LRB No. Used Action  LENS IOL TECNIS EYHANCE 24.0 - [id number] Intraocular Lens LENS IOL TECNIS EYHANCE 24.0 [id number] [employer]  Left 1 Implanted    Indications:  Visually significant age-related cataract, Left Eye  Procedure:  The patient was seen and identified in the pre-operative area. The operative eye was identified and dilated.  The operative eye was marked.  Topical anesthesia was administered to the operative eye.     The patient was then to the operative suite and placed in the supine position.  A timeout was performed confirming the patient, procedure to be performed, and all other relevant information.   The patient's face was prepped and draped in the usual fashion for intra-ocular surgery.  A lid speculum was placed into the operative eye and the surgical microscope moved into place and focused.  An inferotemporal paracentesis was created using a 20 gauge paracentesis blade.  BSS mixed with Omidria , followed by 1% lidocaine  was injected into the anterior chamber.  Viscoelastic was injected into the anterior chamber.  A temporal clear-corneal main wound incision was created using a 2.63mm microkeratome.  A continuous curvilinear capsulorrhexis was initiated using an irrigating cystitome and completed using capsulorrhexis forceps.  Hydrodissection and hydrodeliniation were performed.  Viscoelastic was  injected into the anterior chamber.  A phacoemulsification handpiece and a chopper as a second instrument were used to remove the nucleus and epinucleus. The irrigation/aspiration handpiece was used to remove any remaining cortical material.   The capsular bag was reinflated with viscoelastic, checked, and found to be intact.  The intraocular lens was inserted into the capsular bag.  The irrigation/aspiration handpiece was used to remove any remaining viscoelastic.  The clear corneal wound and paracentesis wounds were then hydrated and checked with Weck-Cels to be watertight. Moxifloxacin  was instilled into the anterior chamber.  The lid-speculum and drape were removed. The lower punctum was dilated, and the dextenza  implant was inserted into it. The patient's face was cleaned with a wet and dry 4x4.  A clear shield was taped over the eye. The patient was taken to the post-operative care unit in good condition, having tolerated the procedure well.  Post-Op Instructions: The patient will follow up at [facility] for a same day post-operative evaluation and will receive all other orders and instructions.

## 2023-09-25 NOTE — Anesthesia Postprocedure Evaluation (Signed)
 Anesthesia Post Note  Patient: Tanya Daniel  Procedure(s) Performed: PHACOEMULSIFICATION, CATARACT, WITH IOL INSERTION (Left: Eye) INSERTION, STENT, DRUG-ELUTING, LACRIMAL CANALICULUS (Left: Eye)  Patient location during evaluation: Phase II Anesthesia Type: MAC Level of consciousness: awake Pain management: pain level controlled Vital Signs Assessment: post-procedure vital signs reviewed and stable Respiratory status: spontaneous breathing and respiratory function stable Cardiovascular status: blood pressure returned to baseline and stable Postop Assessment: no headache and no apparent nausea or vomiting Anesthetic complications: no Comments: Late entry   No notable events documented.   Last Vitals:  Vitals:   09/25/23 0854 09/25/23 1001  BP: (!) 154/75 (!) 145/74  Pulse:  67  Resp: 15 14  Temp: 36.7 C 37.1 C  SpO2: 100% 100%    Last Pain:  Vitals:   09/25/23 1001  TempSrc: Oral  PainSc: 0-No pain                 [name]

## 2023-09-25 NOTE — Discharge Instructions (Signed)
 Please discharge patient when stable, will follow up today with Dr. Ilsa Iha at the [facility] office immediately following discharge.  Leave shield in place until visit.  All paperwork with discharge instructions will be given at the office.  [facility] Address:  [address]  [place], [place] [address]  Dr. Chaya Jan Phone: [phone]

## 2023-09-25 NOTE — Interval H&P Note (Signed)
 History and Physical Interval Note:  09/25/2023 9:03 AM   The H and P was reviewed and updated. The patient was examined.  No changes were found after exam.  The surgical eye was marked.   Tanya Daniel

## 2023-09-25 NOTE — Anesthesia Preprocedure Evaluation (Signed)
Anesthesia Evaluation  Patient identified by MRN, date of birth, ID band Patient awake    Reviewed: Allergy & Precautions, H&P , NPO status , Patient's Chart, lab work & pertinent test results, reviewed documented beta blocker date and time   Airway Mallampati: II  TM Distance: >3 FB Neck ROM: full    Dental no notable dental hx.    Pulmonary neg pulmonary ROS   Pulmonary exam normal breath sounds clear to auscultation       Cardiovascular Exercise Tolerance: Good hypertension, negative cardio ROS  Rhythm:regular Rate:Normal     Neuro/Psych negative neurological ROS  negative psych ROS   GI/Hepatic negative GI ROS, Neg liver ROS,,,  Endo/Other  negative endocrine ROS    Renal/GU negative Renal ROS  negative genitourinary   Musculoskeletal   Abdominal   Peds  Hematology negative hematology ROS (+)   Anesthesia Other Findings   Reproductive/Obstetrics negative OB ROS                             Anesthesia Physical Anesthesia Plan  ASA: 2  Anesthesia Plan: MAC   Post-op Pain Management:    Induction:   PONV Risk Score and Plan:   Airway Management Planned:   Additional Equipment:   Intra-op Plan:   Post-operative Plan:   Informed Consent: I have reviewed the patients History and Physical, chart, labs and discussed the procedure including the risks, benefits and alternatives for the proposed anesthesia with the patient or authorized representative who has indicated his/her understanding and acceptance.     Dental Advisory Given  Plan Discussed with: CRNA  Anesthesia Plan Comments:        Anesthesia Quick Evaluation

## 2023-09-25 NOTE — Transfer of Care (Signed)
 Immediate Anesthesia Transfer of Care Note  Patient: Tanya Daniel  Procedure(s) Performed: PHACOEMULSIFICATION, CATARACT, WITH IOL INSERTION (Left: Eye) INSERTION, STENT, DRUG-ELUTING, LACRIMAL CANALICULUS (Left: Eye)  Patient Location: PACU  Anesthesia Type:MAC  Level of Consciousness: awake, alert , oriented, and patient cooperative  Airway & Oxygen Therapy: Patient Spontanous Breathing  Post-op Assessment: Report given to RN, Post -op Vital signs reviewed and stable, and Patient moving all extremities X 4  Post vital signs: Reviewed and stable  Last Vitals:  Vitals Value Taken Time  BP 145/74 09/25/23 1001  Temp 37.1 C 09/25/23 1001  Pulse 67 09/25/23 1001  Resp 14 09/25/23 1001  SpO2 100 % 09/25/23 1001    Last Pain:  Vitals:   09/25/23 1001  TempSrc: Oral  PainSc: 0-No pain         Complications: No notable events documented.

## 2023-09-28 ENCOUNTER — Encounter (HOSPITAL_COMMUNITY): Payer: Self-pay | Admitting: Optometry

## 2023-10-05 ENCOUNTER — Encounter (HOSPITAL_COMMUNITY)
Admission: RE | Admit: 2023-10-05 | Discharge: 2023-10-05 | Disposition: A | Discharge status: Home / Self Care | Source: Ambulatory Visit | Attending: Optometry | Admitting: Optometry

## 2023-10-05 ENCOUNTER — Encounter (HOSPITAL_COMMUNITY): Payer: Self-pay

## 2023-10-06 DIAGNOSIS — H2511 Age-related nuclear cataract, right eye: Secondary | ICD-10-CM | POA: Diagnosis not present

## 2023-10-06 NOTE — H&P (Signed)
 Surgical History & Physical  Patient Name: Tanya Daniel  DOB: 30-Jan-1936  Surgery: Cataract extraction with intraocular lens implant phacoemulsification; Right Eye Surgeon: Ardeth Krabbe MD Surgery Date: 10/09/2023 Pre-Op Date: 09/30/2023  HPI: A 82 Yr. old female patient 1. The patient is returning after cataract surgery. The left eye is affected. Status post cataract surgery, which began 5 days ago: Since the last visit, the affected area feels improvement. The patient's vision is improved. The condition's severity is constant. Patient is following medication instructions. 2. The patient is returning for a cataract follow-up of the right eye. Since the last visit, the affected area is tolerating. The patient's vision is blurry. The condition's severity is constant. This is negatively affecting the patient's quality of life and the patient is unable to function adequately in life with the current level of vision. HPI was performed by Ardeth Krabbe .  Medical History: Dry Eyes Cataracts  Arthritis High Blood Pressure LDL  Review of Systems Cardiovascular High Blood Pressure Musculoskeletal arthritis pains All recorded systems are negative except as noted above.  Social Never smoked  Medication Latanoprost, Prednisolon-moxiflox-bromf(pf),  Alendronate, Promethazine-DM, Lisinopril-hydrochlorothiazide, Tramadol, Amlodipine, Simvastatin, Benzonatate , Gabapentin  Sx/Procedures Phaco c IOL OS - Dextenza ,  Hysterectomy  Drug Allergies  NKDA  History & Physical: Heent: cataract  NECK: supple without bruits LUNGS: lungs clear to auscultation CV: regular rate and rhythm Abdomen: soft and non-tender  Impression & Plan: Assessment: 1.  CATARACT EXTRACTION STATUS; Left Eye (Z98.42) 2.  INTRAOCULAR LENS IOL ; Left Eye (Z96.1) 3.  ANATOMICAL NARROW ANGLE; Left Eye (H40.032) - Resolved 4.  CATARACT NUCLEAR SCLEROSIS AGE RELATED; Both Eyes (H25.13)  Plan: 1.  POD5 Doing well. All  post-op precautions discussed and instructions reviewed. Written instructions given.  Vision may be limited OS by glaucoma. Patient feels it is much better than it was. Cornea compact. Patient ready to proceed with OD  2.   3.  as above  4.  Cataracts are visually significant and account for the patient's complaints. Discussed all risks, benefits, procedures and recovery, including infection, loss of vision and eye, need for glasses after surgery or additional procedures. Patient understands changing glasses will not improve vision. Patient indicated understanding of procedure. All questions answered. Patient desires to have surgery, recommend phacoemulsification with intraocular lens. Patient to have preliminary testing necessary ([employer]/IOL Master, Mac OCT, TOPO) Educational materials provided:Cataract.  Plan: - Proceed with cataract surgery OD when ready - Plan for best distance target with DIB00 - No DM, no fuchs, no prior eye surgery - narrow angle, has not been dilated yet - dextenza 

## 2023-10-09 ENCOUNTER — Encounter (HOSPITAL_COMMUNITY): Payer: Self-pay | Admitting: Optometry

## 2023-10-09 ENCOUNTER — Ambulatory Visit (HOSPITAL_COMMUNITY): Admitting: Anesthesiology

## 2023-10-09 ENCOUNTER — Ambulatory Visit (HOSPITAL_COMMUNITY)
Admission: RE | Admit: 2023-10-09 | Discharge: 2023-10-09 | Disposition: A | Discharge status: Home / Self Care | Attending: Optometry | Admitting: Optometry

## 2023-10-09 ENCOUNTER — Other Ambulatory Visit: Payer: Self-pay

## 2023-10-09 ENCOUNTER — Encounter (HOSPITAL_COMMUNITY)
Admission: RE | Disposition: A | Discharge status: Home / Self Care | Payer: Self-pay | Source: Home / Self Care | Attending: Optometry

## 2023-10-09 DIAGNOSIS — H5711 Ocular pain, right eye: Secondary | ICD-10-CM | POA: Diagnosis not present

## 2023-10-09 DIAGNOSIS — I10 Essential (primary) hypertension: Secondary | ICD-10-CM | POA: Diagnosis not present

## 2023-10-09 DIAGNOSIS — H2511 Age-related nuclear cataract, right eye: Secondary | ICD-10-CM

## 2023-10-09 HISTORY — PX: CATARACT EXTRACTION W/PHACO: SHX586

## 2023-10-09 HISTORY — PX: INSERTION, STENT, DRUG-ELUTING, LACRIMAL CANALICULUS: SHX7453

## 2023-10-09 SURGERY — PHACOEMULSIFICATION, CATARACT, WITH IOL INSERTION
Anesthesia: Monitor Anesthesia Care | Site: Eye | Laterality: Right

## 2023-10-09 MED ORDER — TROPICAMIDE 1 % OP SOLN
1.0000 [drp] | OPHTHALMIC | Status: AC
  Administered 2023-10-09 (×3): 1 [drp] via OPHTHALMIC

## 2023-10-09 MED ORDER — SODIUM CHLORIDE 0.9% FLUSH: 3.0000 mL | Freq: Two times a day (BID) | INTRAVENOUS | Status: DC

## 2023-10-09 MED ORDER — PHENYLEPHRINE HCL 2.5 % OP SOLN
1.0000 [drp] | OPHTHALMIC | Status: AC
  Administered 2023-10-09 (×3): 1 [drp] via OPHTHALMIC

## 2023-10-09 MED ORDER — BSS IO SOLN
INTRAOCULAR | Status: DC | PRN
  Administered 2023-10-09: 15 mL via INTRAOCULAR

## 2023-10-09 MED ORDER — DEXAMETHASONE 0.4 MG OP INST
VAGINAL_INSERT | OPHTHALMIC | Status: DC | PRN
  Administered 2023-10-09: .4 mg via OPHTHALMIC

## 2023-10-09 MED ORDER — STERILE WATER FOR IRRIGATION IR SOLN
Status: DC | PRN
  Administered 2023-10-09: 250 mL

## 2023-10-09 MED ORDER — LIDOCAINE HCL 3.5 % OP GEL
1.0000 | Freq: Once | OPHTHALMIC | Status: AC
  Administered 2023-10-09: 1 via OPHTHALMIC

## 2023-10-09 MED ORDER — SIGHTPATH DOSE#1 NA HYALUR & NA CHOND-NA HYALUR IO KIT
PACK | INTRAOCULAR | Status: DC | PRN
  Administered 2023-10-09: 1 via OPHTHALMIC

## 2023-10-09 MED ORDER — TETRACAINE HCL 0.5 % OP SOLN
1.0000 [drp] | OPHTHALMIC | Status: AC
  Administered 2023-10-09 (×3): 1 [drp] via OPHTHALMIC

## 2023-10-09 MED ORDER — MOXIFLOXACIN HCL 5 MG/ML IO SOLN
INTRAOCULAR | Status: DC | PRN
  Administered 2023-10-09: .2 mL via INTRACAMERAL

## 2023-10-09 MED ORDER — SODIUM CHLORIDE 0.9% FLUSH: 3.0000 mL | INTRAVENOUS | Status: DC | PRN

## 2023-10-09 MED ORDER — LIDOCAINE HCL (PF) 1 % IJ SOLN
INTRAMUSCULAR | Status: DC | PRN
  Administered 2023-10-09: 2 mL

## 2023-10-09 MED ORDER — DEXAMETHASONE 0.4 MG OP INST
VAGINAL_INSERT | OPHTHALMIC | Status: AC
  Filled 2023-10-09: qty 1

## 2023-10-09 MED ORDER — POVIDONE-IODINE 5 % OP SOLN
OPHTHALMIC | Status: DC | PRN
  Administered 2023-10-09: 1 via OPHTHALMIC

## 2023-10-09 MED ORDER — PHENYLEPHRINE-KETOROLAC 1-0.3 % IO SOLN
INTRAOCULAR | Status: DC | PRN
  Administered 2023-10-09: 500 mL via OPHTHALMIC

## 2023-10-09 SURGICAL SUPPLY — 14 items
CATARACT SUITE SIGHTPATH (MISCELLANEOUS) ×1 IMPLANT
CLOTH BEACON ORANGE TIMEOUT ST (SAFETY) ×1 IMPLANT
DRSG TEGADERM 4X4.75 (GAUZE/BANDAGES/DRESSINGS) ×1 IMPLANT
EYE SHIELD UNIVERSAL CLEAR (GAUZE/BANDAGES/DRESSINGS) IMPLANT
FEE CATARACT SUITE SIGHTPATH (MISCELLANEOUS) ×1 IMPLANT
GLOVE BIOGEL PI IND STRL 7.0 (GLOVE) ×2 IMPLANT
LENS IOL TECNIS EYHANCE 23.5 (Intraocular Lens) IMPLANT
NDL HYPO 18GX1.5 BLUNT FILL (NEEDLE) ×1 IMPLANT
NEEDLE HYPO 18GX1.5 BLUNT FILL (NEEDLE) ×1 IMPLANT
PAD ARMBOARD POSITIONER FOAM (MISCELLANEOUS) ×1 IMPLANT
POSITIONER HEAD 8X9X4 ADT (SOFTGOODS) ×1 IMPLANT
SYR TB 1ML LL NO SAFETY (SYRINGE) ×1 IMPLANT
TAPE SURG TRANSPORE 1 IN (GAUZE/BANDAGES/DRESSINGS) IMPLANT
WATER STERILE IRR 250ML POUR (IV SOLUTION) ×1 IMPLANT

## 2023-10-09 NOTE — Anesthesia Preprocedure Evaluation (Signed)
Anesthesia Evaluation  Patient identified by MRN, date of birth, ID band Patient awake    Reviewed: Allergy & Precautions, H&P , NPO status , Patient's Chart, lab work & pertinent test results, reviewed documented beta blocker date and time   Airway Mallampati: II  TM Distance: >3 FB Neck ROM: full    Dental no notable dental hx.    Pulmonary neg pulmonary ROS   Pulmonary exam normal breath sounds clear to auscultation       Cardiovascular Exercise Tolerance: Good hypertension, negative cardio ROS  Rhythm:regular Rate:Normal     Neuro/Psych negative neurological ROS  negative psych ROS   GI/Hepatic negative GI ROS, Neg liver ROS,,,  Endo/Other  negative endocrine ROS    Renal/GU negative Renal ROS  negative genitourinary   Musculoskeletal   Abdominal   Peds  Hematology negative hematology ROS (+)   Anesthesia Other Findings   Reproductive/Obstetrics negative OB ROS                             Anesthesia Physical Anesthesia Plan  ASA: 2  Anesthesia Plan: MAC   Post-op Pain Management:    Induction:   PONV Risk Score and Plan:   Airway Management Planned:   Additional Equipment:   Intra-op Plan:   Post-operative Plan:   Informed Consent: I have reviewed the patients History and Physical, chart, labs and discussed the procedure including the risks, benefits and alternatives for the proposed anesthesia with the patient or authorized representative who has indicated his/her understanding and acceptance.     Dental Advisory Given  Plan Discussed with: CRNA  Anesthesia Plan Comments:        Anesthesia Quick Evaluation

## 2023-10-09 NOTE — Transfer of Care (Signed)
 Immediate Anesthesia Transfer of Care Note  Patient: Tanya Daniel  Procedure(s) Performed: PHACOEMULSIFICATION, CATARACT, WITH IOL INSERTION (Right: Eye) INSERTION, STENT, DRUG-ELUTING, LACRIMAL CANALICULUS (Right: Eye)  Patient Location: Short Stay  Anesthesia Type:MAC  Level of Consciousness: awake, alert , oriented, and patient cooperative  Airway & Oxygen Therapy: Patient Spontanous Breathing  Post-op Assessment: Report given to RN, Post -op Vital signs reviewed and stable, and Patient moving all extremities X 4  Post vital signs: Reviewed and stable  Last Vitals:  Vitals Value Taken Time  BP    Temp 37.2 C 10/09/23 0937  Pulse 89 10/09/23 0937  Resp 18 10/09/23 0937  SpO2 98 % 10/09/23 0937    Last Pain:  Vitals:   10/09/23 0937  TempSrc: Oral  PainSc:          Complications: No notable events documented.

## 2023-10-09 NOTE — Interval H&P Note (Signed)
 History and Physical Interval Note:  10/09/2023 8:26 AM  The H and P was reviewed and updated. The patient was examined.  No changes were found after exam.  The surgical eye was marked.   Katlin Ciszewski

## 2023-10-09 NOTE — Discharge Instructions (Signed)
 Please discharge patient when stable, will follow up today with Dr. Ilsa Iha at the [facility] office immediately following discharge.  Leave shield in place until visit.  All paperwork with discharge instructions will be given at the office.  [facility] Address:  [address]  [place], [place] [address]  Dr. Chaya Jan Phone: [phone]

## 2023-10-09 NOTE — Op Note (Signed)
 Date of procedure: 10/09/23  Pre-operative diagnosis: Visually significant age-related nuclear cataract, Right Eye (H25.11)  Post-operative diagnosis: Visually significant age-related nuclear cataract, Right Eye H25.11; Ocular Pain and Inflammation, Right eye H57.11  Procedure: Removal of cataract via phacoemulsification and insertion of intra-ocular lens J&J DIBOO +23.5D into the capsular bag of the Right Eye  Attending surgeon: Ardeth Krabbe, MD  Anesthesia: MAC, Topical Akten   Complications: None  Estimated Blood Loss: <77mL (minimal)  Specimens: None  Implants:  Implant Name Type Inv. Item Serial No. Manufacturer Lot No. LRB No. Used Action  LENS IOL TECNIS EYHANCE 23.5 - [id number] Intraocular Lens LENS IOL TECNIS EYHANCE 23.5 [id number] [employer]  Right 1 Implanted    Indications:  Visually significant age-related cataract, Right Eye  Procedure:  The patient was seen and identified in the pre-operative area. The operative eye was identified and dilated.  The operative eye was marked.  Topical anesthesia was administered to the operative eye.     The patient was then to the operative suite and placed in the supine position.  A timeout was performed confirming the patient, procedure to be performed, and all other relevant information.   The patient's face was prepped and draped in the usual fashion for intra-ocular surgery.  A lid speculum was placed into the operative eye and the surgical microscope moved into place and focused.  A superotemporal paracentesis was created using a 20 gauge paracentesis blade.  BSS mixed with Omidria , followed by 1% lidocaine  was injected into the anterior chamber.  Viscoelastic was injected into the anterior chamber.  A temporal clear-corneal main wound incision was created using a 2.43mm microkeratome.  A continuous curvilinear capsulorrhexis was initiated using an irrigating cystitome and completed using capsulorrhexis forceps.  Hydrodissection and  hydrodeliniation were performed.  Viscoelastic was injected into the anterior chamber.  A phacoemulsification handpiece and a chopper as a second instrument were used to remove the nucleus and epinucleus. The irrigation/aspiration handpiece was used to remove any remaining cortical material.   The capsular bag was reinflated with viscoelastic, checked, and found to be intact.  The intraocular lens was inserted into the capsular bag.  The irrigation/aspiration handpiece was used to remove any remaining viscoelastic.  The clear corneal wound and paracentesis wounds were then hydrated and checked with Weck-Cels to be watertight. Moxifloxacin  was instilled into the anterior chamber.  The lid-speculum and drape were removed. The lower punctum was dilated, and the dextenza  implant was inserted into it. The patient's face was cleaned with a wet and dry 4x4. A clear shield was taped over the eye. The patient was taken to the post-operative care unit in good condition, having tolerated the procedure well.  Post-Op Instructions: The patient will follow up at [facility] for a same day post-operative evaluation and will receive all other orders and instructions.

## 2023-10-12 ENCOUNTER — Encounter (HOSPITAL_COMMUNITY): Payer: Self-pay | Admitting: Optometry

## 2023-10-12 DIAGNOSIS — E785 Hyperlipidemia, unspecified: Secondary | ICD-10-CM | POA: Diagnosis not present

## 2023-10-12 DIAGNOSIS — I10 Essential (primary) hypertension: Secondary | ICD-10-CM | POA: Diagnosis not present

## 2023-10-16 NOTE — Anesthesia Postprocedure Evaluation (Signed)
 Anesthesia Post Note  Patient: Tanya Daniel  Procedure(s) Performed: PHACOEMULSIFICATION, CATARACT, WITH IOL INSERTION (Right: Eye) INSERTION, STENT, DRUG-ELUTING, LACRIMAL CANALICULUS (Right: Eye)  Patient location during evaluation: Phase II Anesthesia Type: MAC Level of consciousness: awake Pain management: pain level controlled Vital Signs Assessment: post-procedure vital signs reviewed and stable Respiratory status: spontaneous breathing and respiratory function stable Cardiovascular status: blood pressure returned to baseline and stable Postop Assessment: no headache and no apparent nausea or vomiting Anesthetic complications: no Comments: Late entry   No notable events documented.   Last Vitals:  Vitals:   10/09/23 0937 10/09/23 0939  BP:  (!) 149/74  Pulse: 89   Resp: 18   Temp: 37.2 C   SpO2: 98%     Last Pain:  Vitals:   10/09/23 0937  TempSrc: Oral  PainSc:                  [name]

## 2023-11-12 DIAGNOSIS — E785 Hyperlipidemia, unspecified: Secondary | ICD-10-CM | POA: Diagnosis not present

## 2023-11-12 DIAGNOSIS — I10 Essential (primary) hypertension: Secondary | ICD-10-CM | POA: Diagnosis not present

## 2023-11-24 DIAGNOSIS — G5602 Carpal tunnel syndrome, left upper limb: Secondary | ICD-10-CM | POA: Diagnosis not present

## 2023-12-12 DIAGNOSIS — E785 Hyperlipidemia, unspecified: Secondary | ICD-10-CM | POA: Diagnosis not present

## 2023-12-12 DIAGNOSIS — I10 Essential (primary) hypertension: Secondary | ICD-10-CM | POA: Diagnosis not present

## 2024-01-12 DIAGNOSIS — I10 Essential (primary) hypertension: Secondary | ICD-10-CM | POA: Diagnosis not present

## 2024-01-12 DIAGNOSIS — E785 Hyperlipidemia, unspecified: Secondary | ICD-10-CM | POA: Diagnosis not present

## 2024-02-05 DIAGNOSIS — I10 Essential (primary) hypertension: Secondary | ICD-10-CM | POA: Diagnosis not present

## 2024-02-05 DIAGNOSIS — G5602 Carpal tunnel syndrome, left upper limb: Secondary | ICD-10-CM | POA: Diagnosis not present

## 2024-02-05 DIAGNOSIS — E785 Hyperlipidemia, unspecified: Secondary | ICD-10-CM | POA: Diagnosis not present

## 2024-02-05 DIAGNOSIS — Z23 Encounter for immunization: Secondary | ICD-10-CM | POA: Diagnosis not present

## 2024-02-05 DIAGNOSIS — M810 Age-related osteoporosis without current pathological fracture: Secondary | ICD-10-CM | POA: Diagnosis not present

## 2024-02-23 DIAGNOSIS — G5602 Carpal tunnel syndrome, left upper limb: Secondary | ICD-10-CM | POA: Diagnosis not present

## 2024-03-06 DIAGNOSIS — I10 Essential (primary) hypertension: Secondary | ICD-10-CM | POA: Diagnosis not present

## 2024-03-06 DIAGNOSIS — E785 Hyperlipidemia, unspecified: Secondary | ICD-10-CM | POA: Diagnosis not present

## 2024-03-16 DIAGNOSIS — E785 Hyperlipidemia, unspecified: Secondary | ICD-10-CM | POA: Diagnosis not present

## 2024-03-16 DIAGNOSIS — I10 Essential (primary) hypertension: Secondary | ICD-10-CM | POA: Diagnosis not present

## 2024-03-16 DIAGNOSIS — R7303 Prediabetes: Secondary | ICD-10-CM | POA: Diagnosis not present

## 2024-04-11 ENCOUNTER — Emergency Department (HOSPITAL_COMMUNITY)

## 2024-04-11 ENCOUNTER — Encounter (HOSPITAL_COMMUNITY): Payer: Self-pay | Admitting: Emergency Medicine

## 2024-04-11 ENCOUNTER — Other Ambulatory Visit: Payer: Self-pay

## 2024-04-11 ENCOUNTER — Emergency Department (HOSPITAL_COMMUNITY)
Admission: EM | Admit: 2024-04-11 | Discharge: 2024-04-11 | Disposition: A | Discharge status: Home / Self Care | Source: Ambulatory Visit | Attending: Emergency Medicine | Admitting: Emergency Medicine

## 2024-04-11 DIAGNOSIS — M25511 Pain in right shoulder: Secondary | ICD-10-CM | POA: Insufficient documentation

## 2024-04-11 DIAGNOSIS — Z0189 Encounter for other specified special examinations: Secondary | ICD-10-CM | POA: Insufficient documentation

## 2024-04-11 DIAGNOSIS — Z0000 Encounter for general adult medical examination without abnormal findings: Secondary | ICD-10-CM

## 2024-04-11 DIAGNOSIS — Z7982 Long term (current) use of aspirin: Secondary | ICD-10-CM | POA: Insufficient documentation

## 2024-04-11 NOTE — ED Triage Notes (Addendum)
 Pt reports inability to lift right shoulder for years, denies pain and injury, has been being treated by PCP for torn rotator cuff, recently seen by [facility] provider who sent her to ED for x-ray

## 2024-04-11 NOTE — Discharge Instructions (Addendum)
 Thank you for visiting the Emergency Department today.  It was a pleasure to be part of your healthcare team.  You received x-ray imaging of your right shoulder and were provided a disk, and printed imaging to give to your orthopedic provider. If you develop any pain or neurological changes, please call 911 or be seen at the emergency department. Thank you for trusting us  with your health.

## 2024-04-11 NOTE — ED Provider Notes (Signed)
 [place] EMERGENCY DEPARTMENT AT [facility] Provider Note   CSN: [id number] Arrival date & time: 04/11/24  1610     Patient presents with: Shoulder Pain   Tanya Daniel is a 88 y.o. female with a history of shoulder dislocation x 20 years presents to the ED sent by her [facility] provider for outpatient imaging of her right shoulder. The patient states she is having no pain to the affected shoulder, no sensory deficits. The patient states that she has some pain on ROM, but not at rest. The patient is only looking to have imaging put on a disk to give to her provider, otherwise has no other complaint.     Shoulder Pain      Prior to Admission medications   Medication Sig Start Date End Date Taking? Authorizing Provider  acetaminophen  (TYLENOL ) 500 MG tablet Take 1 tablet (500 mg total) by mouth every 6 (six) hours as needed. 02/16/21   Nanavati, Ankit, MD  alendronate (FOSAMAX) 70 MG tablet Take 70 mg by mouth once a week. Take with a full glass of water  on an empty stomach.    [provider]  amLODipine (NORVASC) 5 MG tablet Take 1 tablet by mouth daily. 09/25/17   [provider]  Ascorbic Acid (VITAMIN C PO) Take by mouth.    [provider]  aspirin EC 81 MG tablet Take 81 mg by mouth daily.    [provider]  benzonatate  (TESSALON ) 100 MG capsule Take 1 capsule (100 mg total) by mouth every 8 (eight) hours. 07/09/23   Suellen Cantor A, PA-C  Calcium Carb-Cholecalciferol (CALCIUM + D3 PO) Take by mouth.    [provider]  HYDROcodone -acetaminophen  (NORCO/VICODIN) 5-325 MG tablet Take 0.5 tablets by mouth every 6 (six) hours as needed. 10/19/17   Murray, Alyssa B, PA-C  Lidocaine  4 % PTCH Apply 1 patch topically 2 (two) times daily. 02/16/21   Charlyn Sora, MD  lisinopril-hydrochlorothiazide (PRINZIDE,ZESTORETIC) 10-12.5 MG per tablet Take 1 tablet by mouth daily.    [provider]  naproxen  sodium (ALEVE ) 220 MG  tablet Take 1 tablet (220 mg total) by mouth 2 (two) times daily with a meal. 02/16/21   Charlyn Sora, MD  Omega-3 Fatty Acids (FISH OIL) 1000 MG CAPS Take 1 capsule by mouth 3 (three) times daily.     [provider]  simvastatin (ZOCOR) 40 MG tablet Take 40 mg by mouth daily.    [provider]  traMADol (ULTRAM) 50 MG tablet Take 50 mg by mouth every 6 (six) hours as needed for moderate pain.     [provider]    Allergies: Patient has no known allergies.    Review of Systems  Skin:        Right shoulder assessment    Updated Vital Signs BP (!) 160/71   Pulse 85   Temp 97.7 F (36.5 C)   Resp 15   Ht 5' 1 (1.549 m)   Wt 55.3 kg   SpO2 100%   BMI 23.05 kg/m   Physical Exam Vitals and nursing note reviewed.  Constitutional:      General: She is not in acute distress.    Appearance: Normal appearance.  HENT:     Head: Normocephalic and atraumatic.  Eyes:     Extraocular Movements: Extraocular movements intact.     Conjunctiva/sclera: Conjunctivae normal.     Pupils: Pupils are equal, round, and reactive to light.  Cardiovascular:  Pulses: Normal pulses.  Pulmonary:     Effort: Pulmonary effort is normal. No respiratory distress.  Musculoskeletal:     Right shoulder: Decreased range of motion.     Cervical back: Normal range of motion.     Comments: Mild discomfort on ROM of right shoulder on abduction. Patient denies pain. Mild crepitus noted to glenohumeral joint on flexion and extension. No pain. No instability. No obvious deformity.  Skin:    General: Skin is warm and dry.     Capillary Refill: Capillary refill takes less than 2 seconds.  Neurological:     General: No focal deficit present.     Mental Status: She is alert. Mental status is at baseline.  Psychiatric:        Mood and Affect: Mood normal.     (all labs ordered are listed, but only abnormal results are displayed) Labs Reviewed - No data to  display  EKG: None  Radiology: DG Shoulder Right Result Date: 04/11/2024 EXAM: 1 VIEW XRAY OF THE SHOULDER 04/11/2024 04:34:52 PM COMPARISON: None available. CLINICAL HISTORY: imobility FINDINGS: BONES AND JOINTS: Superior subluxation of the humeral head, consistent with chronic rotator cuff injury. Degenerative changes with joint space narrowing and osteophytes at the glenohumeral joint. No acute fracture. Hypertrophic changes at the acromioclavicular joint. Subacromial narrowing noted. SOFT TISSUES: No abnormal calcifications. Visualized lung is unremarkable. IMPRESSION: 1. Superior subluxation of the humeral head, consistent with chronic rotator cuff injury. 2. Degenerative changes with joint space narrowing and osteophytes at the glenohumeral joint. 3. Hypertrophic changes at the acromioclavicular joint. 4. No acute bony findings. Electronically signed by: Maude Stammer MD 04/11/2024 04:58 PM EST RP Workstation: [id number]     Procedures   Medications Ordered in the ED - No data to display                                  Medical Decision Making Amount and/or Complexity of Data Reviewed Radiology: ordered.   Patient presents to the ED sent by her provider with a request of outpatient imaging -   Additional history obtained: Additional history obtained from Outside Medical Records - patient provided paper record/request for imaging with disk. The patient and her son present for visit was a reliable historian, providing a clear, detailed, and consistent account of the presenting symptoms and relevant medical history. The information was obtained directly from the patient and statements were documented in the patient's own words when possible. No discrepancies were noted between the history provided and available collateral sources.     Imaging Studies ordered: I ordered imaging studies including: DG Shoulder right I independently visualized and interpreted imaging which  showed: Superior subluxation of the humeral head, consistent with chronic rotator cuff injury. Degenerative changes with joint space narrowing and osteophytes at the glenohumeral joint. Hypertrophic changes at the acromioclavicular joint. No acute bony findings. I agree with the radiologist interpretation  Problem List / ED Course: Problem List: X-ray imaging request Emergency Department Course: The patient presented with outpatient imaging request per her [facility] provider . Patient is not complaining of pain at this time. Initial assessment included history, physical exam, and review of prior medical records. Patient received imaging of the requested shoulder, and disc imaging was provided to patient at discharge. Patient's son relays during discharge that he is unhappy that we are not fixing the patient's dislocation that was relayed by the [facility] provider. I educated  the son and patient that shoulder is not currently dislocated and provided imaging and radiologists impression. Dr. Charlyn also communicated with patient and her son about same. Given patient's history, physical exam findings, imaging, plan to discharge patient with referral to orthopedics for further evaluation and care. Discharge: Clinical Assessment:    Working diagnosis: chronic shoulder changes Disposition Plan: The patient is medically stable for discharge from the Emergency Department at this time. Vital signs are within normal limits, and the patient is alert, oriented, and in no acute distress. Evaluation has been completed with no findings necessitating hospital admission or further emergent workup.  Communication:   Patient and her son informed of disposition decision and rationale. Questions addressed.  The imaging results and clinical impression were discussed with the patient at bedside and the patient demonstrated understanding.     Final diagnoses:  X-ray performed  Encounter for musculoskeletal  assessment of shoulder    ED Discharge Orders     None          Willma Duwaine CROME, [place] 04/11/24 [place] Charlyn Sora, MD 04/11/24 1958

## 2024-04-21 ENCOUNTER — Ambulatory Visit: Admitting: Orthopedic Surgery
